# Patient Record
Sex: Male | Born: 1970 | Race: White | Hispanic: No | Marital: Married | Smoking: Never smoker
Health system: Southern US, Community
[De-identification: ages and names within clinical notes are randomized; demographics above are authoritative.]

## PROBLEM LIST (undated history)

## (undated) DIAGNOSIS — E7800 Pure hypercholesterolemia, unspecified: Secondary | ICD-10-CM

## (undated) DIAGNOSIS — E781 Pure hyperglyceridemia: Secondary | ICD-10-CM

## (undated) HISTORY — DX: Pure hyperglyceridemia: E78.1

## (undated) HISTORY — DX: Pure hypercholesterolemia, unspecified: E78.00

## (undated) HISTORY — PX: US ECHOCARDIOGRAPHY: HXRAD669

---

## 1999-10-13 HISTORY — PX: OTHER SURGICAL HISTORY: SHX169

## 2007-10-24 ENCOUNTER — Ambulatory Visit: Payer: Self-pay | Admitting: Family Medicine

## 2007-10-24 DIAGNOSIS — R05 Cough: Secondary | ICD-10-CM | POA: Insufficient documentation

## 2007-10-24 DIAGNOSIS — R059 Cough, unspecified: Secondary | ICD-10-CM | POA: Insufficient documentation

## 2007-10-24 DIAGNOSIS — R051 Acute cough: Secondary | ICD-10-CM | POA: Insufficient documentation

## 2007-11-01 ENCOUNTER — Ambulatory Visit: Payer: Self-pay | Admitting: Family Medicine

## 2007-11-02 DIAGNOSIS — E781 Pure hyperglyceridemia: Secondary | ICD-10-CM | POA: Insufficient documentation

## 2007-11-02 LAB — CONVERTED CEMR LAB
AST: 31 units/L (ref 0–37)
Albumin: 4.2 g/dL (ref 3.5–5.2)
Alkaline Phosphatase: 80 units/L (ref 39–117)
CO2: 29 meq/L (ref 19–32)
Calcium: 9.4 mg/dL (ref 8.4–10.5)
Creatinine, Ser: 1.1 mg/dL (ref 0.4–1.5)
Glucose, Bld: 85 mg/dL (ref 70–99)
HDL: 27.8 mg/dL — ABNORMAL LOW (ref >39.0)
Total CHOL/HDL Ratio: 6.3
Triglycerides: 226 mg/dL (ref 0–149)

## 2008-01-31 ENCOUNTER — Ambulatory Visit: Payer: Self-pay | Admitting: Family Medicine

## 2008-02-02 LAB — CONVERTED CEMR LAB
Cholesterol: 192 mg/dL (ref 0–200)
Direct LDL: 116.4 mg/dL
VLDL: 45 mg/dL — ABNORMAL HIGH (ref 0–40)

## 2008-03-01 ENCOUNTER — Ambulatory Visit: Payer: Self-pay | Admitting: Family Medicine

## 2008-03-01 DIAGNOSIS — M25539 Pain in unspecified wrist: Secondary | ICD-10-CM | POA: Insufficient documentation

## 2008-03-07 ENCOUNTER — Encounter: Payer: Self-pay | Admitting: Family Medicine

## 2008-03-30 ENCOUNTER — Ambulatory Visit: Payer: Self-pay | Admitting: Family Medicine

## 2008-04-11 ENCOUNTER — Telehealth: Payer: Self-pay | Admitting: Family Medicine

## 2008-04-17 ENCOUNTER — Encounter: Payer: Self-pay | Admitting: Family Medicine

## 2008-05-03 ENCOUNTER — Telehealth: Payer: Self-pay | Admitting: Family Medicine

## 2008-05-07 ENCOUNTER — Telehealth: Payer: Self-pay | Admitting: Family Medicine

## 2008-05-10 ENCOUNTER — Encounter: Payer: Self-pay | Admitting: Family Medicine

## 2009-07-31 ENCOUNTER — Ambulatory Visit: Payer: Self-pay | Admitting: Family Medicine

## 2009-07-31 DIAGNOSIS — J069 Acute upper respiratory infection, unspecified: Secondary | ICD-10-CM | POA: Insufficient documentation

## 2010-01-27 ENCOUNTER — Ambulatory Visit: Payer: Self-pay | Admitting: Family Medicine

## 2010-01-27 DIAGNOSIS — J018 Other acute sinusitis: Secondary | ICD-10-CM | POA: Insufficient documentation

## 2010-02-26 ENCOUNTER — Ambulatory Visit: Payer: Self-pay | Admitting: Family Medicine

## 2010-02-26 DIAGNOSIS — M25579 Pain in unspecified ankle and joints of unspecified foot: Secondary | ICD-10-CM | POA: Insufficient documentation

## 2010-05-30 ENCOUNTER — Ambulatory Visit: Payer: Self-pay | Admitting: Internal Medicine

## 2010-05-30 DIAGNOSIS — M79609 Pain in unspecified limb: Secondary | ICD-10-CM | POA: Insufficient documentation

## 2010-06-02 ENCOUNTER — Telehealth: Payer: Self-pay | Admitting: Family Medicine

## 2010-07-21 ENCOUNTER — Ambulatory Visit: Payer: Self-pay | Admitting: Family Medicine

## 2010-11-11 NOTE — Assessment & Plan Note (Signed)
Summary: having pain in right foot/alc   Vital Signs:  Patient profile:   40 year old male Weight:      201.50 pounds Temp:     98.2 degrees F oral Pulse rate:   56 / minute Pulse rhythm:   regular BP sitting:   110 / 70  (left arm) Cuff size:   large  Vitals Entered By: Selena Batten Dance CMA Duncan Dull) (May 30, 2010 11:41 AM) CC: Right foot pain   History of Present Illness: CC: right foot pain   Since spring has had R foot issues.  Inside heel of R foot.  notices pain after running or hiking  ~2 miles.  No fall on foot, denies injury, denies surgery to foot in past.  No other joint or muscle pain.  Has had plantar fasciitis in past on left, but was more distal plantar pain than currently.  Has done stretching exercises and golf ball, not improving.  usually runs about 3 miles 2-3x/wk, has stopped this since March.  Has changed shoes to ones with more support.  h/o high arch, was in custom orthotics years ago and didn't like the feel.  Allergies (verified): No Known Drug Allergies PMH-FH-SH reviewed for relevance  Review of Systems       per hpi  Physical Exam  General:  Well-developed,well-nourished,in no acute distress; alert,appropriate and cooperative throughout examination Msk:  L foot: no pain or tenderness or deformity R foot: mild tenderness to deep palpation at medial calcaneus.  No bruising, no edema, erythema, or warmth.  No pain at achilles tendon or distal plantar fascia.  Unable to palpate calcaneal spur.  + significant pes cavus R > L Pulses:  2+ DP/PT bilat Psych:  Cognition and judgment appear intact. Alert and cooperative with normal attention span and concentration. No apparent delusions, illusions, hallucinations   Impression & Recommendations:  Problem # 1:  HEEL PAIN, RIGHT (ICD-729.5) not consistent with plantar fasciitis.  xray today to eval heel spur, very small spur noted.  Rec pt try heel cushion for normal walking, not running or hiking.  will discuss  with Dr. Patsy Lager about other evaluation options and get back to patient. Orders: T-Foot Right ([id number])  Patient Instructions: 1)  Xrays today with small heel spur 2)  I will talk with Dr. Patsy Lager about your foot pain and we will call you wtih the next step. 3)  Pleasure to meet you today.  Prior Medications: None Current Allergies (reviewed today): No known allergies

## 2010-11-11 NOTE — Assessment & Plan Note (Signed)
Summary: R FOOT PAIN,COUGH/CLE   Vital Signs:  Patient profile:   40 year old male Height:      75 inches Weight:      203.4 pounds BMI:     25.52 Temp:     98.4 degrees F oral Pulse rate:   64 / minute Pulse rhythm:   regular BP sitting:   120 / 70  (left arm) Cuff size:   regular  Vitals Entered By: Benny Lennert CMA Duncan Dull) (Feb 26, 2010 10:39 AM)  History of Present Illness: Chief complaint right foot pain, cough  1. Right foot pain:  2. Cough:  Came in a while ago and has been having a cough. Dex with acid reflux in the past.  Has been getting better. Getting better with proilosec.   For a couple of months. Has been having some pain in his foot. Has had some PF in the past.  Some stiffness in his true ankle.   Hurting a lot with running. Deep around where the talus is.   A couple of miles two times a week.  Now, rested for a couple of weeks.    Allergies (verified): No Known Drug Allergies  Past History:  Past Medical History: PURE HYPERCHOLESTEROLEMIA (ICD-272.0) HYPERTRIGLYCERIDEMIA (ICD-272.1)  Review of Systems      See HPI General:  Denies fever and weakness.  Physical Exam  General:  GEN: WDWN, NAD, Non-toxic, A & O x 3 HEENT: Atraumatic, Normocephalic. Neck supple. No masses, No LAD. Ears and Nose: No external deformity. CV: RRR, No M/G/R. No JVD. No thrill. No extra heart sounds. PULM: CTA B, no wheezes, crackles, rhonchi. No retractions. No resp. distress. No accessory muscle use. EXTR: No c/c/e NEURO: Normal gait.  PSYCH: Normally interactive. Conversant. Not depressed or anxious appearing.  Calm demeanor.   Msk:  Echymosis: no Edema: no - NO EFFUSION AT ANKLE ROM: full LE B Gait: heel toe, non-antalgic MT pain: no Callus pattern: none Lateral Mall: NT Medial Mall: NT Talus: NT Navicular: NT Cuboid: NT Calcaneous: NT Metatarsals: NT 5th MT: NT Phalanges: NT Achilles: NT Plantar Fascia: NT Fat Pad: NT Peroneals: NT Post Tib:  NT Great Toe: Nml motion Ant Drawer: neg ATFL: NT CFL: NT Deltoid: NT Other foot breakdown: none Long arch: preserved - PES CAVUS DRAMATICALLY Transverse arch: preserved Hindfoot breakdown: none Sensation: intact    Impression & Recommendations:  Problem # 1:  ANKLE, PAIN (ICD-719.47) cannot cause pain in the office today on vigorous exam  by history - sounds as if at the talus. significant pes cavus - i suspect some impact with running as root. discussed that bone scan vs MRI could be considered - talar dome injury is possible. for now, pt would like to treat conservatively, which i think is reasonable  Problem # 2:  URI (ICD-465.9) Assessment: Improved  The following medications were removed from the medication list:    Tessalon Perles 100 Mg Caps (Benzonatate) .Marland Kitchen... 1 cap three times a day as needed cough  Patient Instructions: 1)  Hapad: available at www.hapad.com 2)  SPENCO: Available at some sports stores or www.amazon.com 3)  www.pedifix.com and Investment banker, [employer]" website: multiple good foot products 4)  [facility] in [place]: excellent staff, shoe selection, OTC orthotics 5)  Shoes: Birkenstock shoes, [employer] 6)  ***[facility], [address] [place], [place]***   Current Allergies (reviewed today): No known allergies

## 2010-11-11 NOTE — Assessment & Plan Note (Signed)
Summary: CHEST CONGESTION/DLO   Vital Signs:  Patient profile:   40 year old male Weight:      204.25 pounds Temp:     101.0 degrees F oral Pulse rate:   92 / minute Pulse rhythm:   regular BP sitting:   116 / 70  (left arm) Cuff size:   large  Vitals Entered By: Sydell Axon LPN (July 21, 2010 3:32 PM) CC: Body aches, fever, headache, chest congestion and cough   History of Present Illness: Started Thursday, worse Friday.  Fever, aches, chest congestion, cough-dry usually.  Fever since Friday.  101 today.  Occ HA.  No ear pain.  minimal ST, related to coughing.  Possible sick contacts.  Sx usually increase at night.  No wheeze.    Allergies: No Known Drug Allergies  Past History:  Past Surgical History: ECHO: no MVP, benign murmur 2001 ACL replacement  Review of Systems       See HPI.  Otherwise negative.    Physical Exam  General:  GEN: nad, alert and oriented HEENT: mucous membranes moist, TM w/o erythema, nasal epithelium injected, OP with cobblestoning NECK: supple w/o LA CV: rrr. PULM: no inc wob but with bilateral ronchi, cough with deep breath ABD: soft, +bs EXT: no edema    Impression & Recommendations:  Problem # 1:  BRONCHITIS- ACUTE (ICD-466.0) I would treat for presumed atypical PNA.  This doesn't resemble the typical URI/viral process recently prevalent in the community.  Given the exam, I would proceed with antibiotics.  This was d/w patient, including risk and benefit.  He understood/agreed.  His updated medication list for this problem includes:    Alka-seltzer Plus Cold & Cough 02-10-09-325 Mg Caps (Phenyleph-cpm-dm-apap) .Marland Kitchen... As needed    Mucinex 600 Mg Xr12h-tab (Guaifenesin) .Marland Kitchen... As needed    Zithromax 250 Mg Tabs (Azithromycin) .Marland Kitchen... 2 by mouth today, 1 by mouth for 4 days.    Hydrocodone-homatropine 5-1.5 Mg/109ml Syrp (Hydrocodone-homatropine) .Marland KitchenMarland KitchenMarland KitchenMarland Kitchen 5ml by mouth q6h as needed cough  Complete Medication List: 1)  Aspirin 325 Mg Tabs  (Aspirin) .... As needed 2)  Alka-seltzer Plus Cold & Cough 02-10-09-325 Mg Caps (Phenyleph-cpm-dm-apap) .... As needed 3)  Mucinex 600 Mg Xr12h-tab (Guaifenesin) .... As needed 4)  Zithromax 250 Mg Tabs (Azithromycin) .... 2 by mouth today, 1 by mouth for 4 days. 5)  Hydrocodone-homatropine 5-1.5 Mg/36ml Syrp (Hydrocodone-homatropine) .... 5ml by mouth q6h as needed cough  Patient Instructions: 1)  Take 2 regular tylenol every 6-8 hours.  You can alternate with regular ibuprofen, 3 tabs every 6-8 hours with food.  Drink plenty of fluids and use the cough syrup (it may make you drowsy).  Start the antibiotics today.   2)  Out of work until decrease in cough and no fever.   Prescriptions: HYDROCODONE-HOMATROPINE 5-1.5 MG/5ML SYRP (HYDROCODONE-HOMATROPINE) 5ml by mouth q6h as needed cough  #120 ml x 0   Entered and Authorized by:   Crawford Givens MD   Signed by:   Crawford Givens MD on 07/21/2010   Method used:   Print then Give to Patient   RxID:   [id number] ZITHROMAX 250 MG TABS (AZITHROMYCIN) 2 by mouth today, 1 by mouth for 4 days.  #6 x 0   Entered and Authorized by:   Crawford Givens MD   Signed by:   Crawford Givens MD on 07/21/2010   Method used:   Print then Give to Patient   RxID:   [id number]   Current Allergies (reviewed today): No  known allergies

## 2010-11-11 NOTE — Progress Notes (Signed)
Summary: phone note f/u  Phone Note Outgoing Call Call back at [facility] Phone [phone]   Summary of Call: called to discuss foot pain, would recommend treat for now as plantar fasciitis with heel cushion, ice water baths if on feet long periods of time, anti inflammatories when pain bad.  Heel spur so small that likely not causing these symptoms.  [phone] Initial call taken by: Eustaquio Boyden  MD,  June 02, 2010 1:58 PM  Follow-up for Phone Call        Patient returning your phone call from yesterday. He can be reached on his cell phone today. Follow-up by: Melody Comas,  June 03, 2010 10:10 AM  Additional Follow-up for Phone Call Additional follow up Details #1::        did he leave his cell phone #?  or is it the one above? Additional Follow-up by: Eustaquio Boyden  MD,  June 03, 2010 10:21 AM    Additional Follow-up for Phone Call Additional follow up Details #2::    It's the one above.  Melody Comas  June 03, 2010 10:21 AM  called and discussed with patient above info.  rec treat as plantar fasciitis for next few months, course of fasciitis is longterm, normally see improvement after 6 months.   Follow-up by: Eustaquio Boyden  MD,  June 03, 2010 11:11 AM

## 2010-11-11 NOTE — Assessment & Plan Note (Signed)
Summary: ?SINUS INFECTION/CLE   Vital Signs:  Patient profile:   40 year old male Weight:      204 pounds Temp:     98.8 degrees F oral Pulse rate:   68 / minute Pulse rhythm:   regular BP sitting:   130 / 82  (left arm) Cuff size:   regular  Vitals Entered By: Lowella Petties CMA (January 27, 2010 4:12 PM) CC: Cough, bloody sinus drainage   History of Present Illness: 40 yo with 2 weeks of sinus congestion. Started with runny nose, dry cough, malaise, fever.  Symptoms were improving until two days ago, started getting worsening sinus congestion, blood streaked sinus drainage and chills (no fever).  Taking OTC mucinex which is no longer helping with symptoms.  No wheezing, CP or SOB.  Current Medications (verified): 1)  Azithromycin 250 Mg  Tabs (Azithromycin) .... 2 By  Mouth Today and Then 1 Daily For 4 Days 2)  Tessalon Perles 100 Mg  Caps (Benzonatate) .Marland Kitchen.. 1 Cap Three Times A Day As Needed Cough  Allergies (verified): No Known Drug Allergies  Review of Systems      See HPI General:  Complains of chills; denies fever. ENT:  Complains of nasal congestion, sinus pressure, and sore throat; denies difficulty swallowing. CV:  Denies chest pain or discomfort. Resp:  Complains of cough; denies shortness of breath, sputum productive, and wheezing.  Physical Exam  General:  Well-developed,well-nourished,in no acute distress; alert,appropriate and cooperative throughout examination, minimally congested. Nose:  Boggy turbinates, sinuses pos throughout Mouth:  Oral mucosa and oropharynx without lesions or exudates.  Teeth in good repair. Lungs:  Normal respiratory effort, chest expands symmetrically. Lungs are clear to auscultation, no crackles or wheezes. Heart:  Normal rate and regular rhythm. S1 and S2 normal without gallop, murmur, click, rub or other extra sounds. Extremities:  no edema Psych:  Cognition and judgment appear intact. Alert and cooperative with normal attention  span and concentration. No apparent delusions, illusions, hallucinations   Impression & Recommendations:  Problem # 1:  OTHER ACUTE SINUSITIS (ICD-461.8) Assessment New Given duration and progression of symptoms, will treat for bacterial sinusitis. He reports resistance to amoxicillin in past.   Continue supportive care as per pt instructions. His updated medication list for this problem includes:    Azithromycin 250 Mg Tabs (Azithromycin) .Marland Kitchen... 2 by  mouth today and then 1 daily for 4 days    Tessalon Perles 100 Mg Caps (Benzonatate) .Marland Kitchen... 1 cap three times a day as needed cough  Complete Medication List: 1)  Azithromycin 250 Mg Tabs (Azithromycin) .... 2 by  mouth today and then 1 daily for 4 days 2)  Tessalon Perles 100 Mg Caps (Benzonatate) .Marland Kitchen.. 1 cap three times a day as needed cough  Patient Instructions: 1)  Take antibiotic as directed.  Drink lots of fluids.  Treat sympotmatically with Mucinex, nasal saline irrigation, and Tylenol/Ibuprofen. Also try claritin D or zyrtec D over the counter- two times a day as needed ( have to sign for them at pharmacy). You can use warm compresses.  Cough suppressant at night. Call if not improving as expected in 5-7 days.  Prescriptions: TESSALON PERLES 100 MG  CAPS (BENZONATATE) 1 cap three times a day as needed cough  #30 x 0   Entered and Authorized by:   Ruthe Mannan MD   Signed by:   Ruthe Mannan MD on 01/27/2010   Method used:   Electronically to  [facility]  [address] (retail)       [address]       [place], [place]  [address]       Ph: [phone] or [phone]       Fax: [phone]   RxID:   [id number] AZITHROMYCIN 250 MG  TABS (AZITHROMYCIN) 2 by  mouth today and then 1 daily for 4 days  #6 x 0   Entered and Authorized by:   Ruthe Mannan MD   Signed by:   Ruthe Mannan MD on 01/27/2010   Method used:   [facility] to        [facility]  [address] (retail)       [address]       [place],  [place]  [address]       Ph: [phone] or [phone]       Fax: [phone]   RxID:   [id number]   Prior Medications: Current Allergies (reviewed today): No known allergies

## 2011-02-23 ENCOUNTER — Ambulatory Visit (INDEPENDENT_AMBULATORY_CARE_PROVIDER_SITE_OTHER): Payer: Managed Care, Other (non HMO) | Admitting: Family Medicine

## 2011-02-23 ENCOUNTER — Encounter: Payer: Self-pay | Admitting: Family Medicine

## 2011-02-23 VITALS — BP 118/70 | HR 72 | Temp 98.4°F | Ht 76.0 in | Wt 205.1 lb

## 2011-02-23 DIAGNOSIS — J329 Chronic sinusitis, unspecified: Secondary | ICD-10-CM | POA: Insufficient documentation

## 2011-02-23 MED ORDER — FLUTICASONE PROPIONATE 50 MCG/ACT NA SUSP: 2.0000 | Freq: Every day | NASAL | Status: DC

## 2011-02-23 NOTE — Patient Instructions (Addendum)
You could have early sinus infection.  Most are viral initially. Take medicine as prescribed: flonase 2 sprays into each nostril daily. Push fluids and plenty of rest. Nasal saline irrigation or neti pot to help drain sinuses. May use simple mucinex with plenty of fluid to help mobilize mucous. Return if fever >101.5, trouble opening/closing mouth, difficulty swallowing, or worsening.  Call us if not improving by Wednesday.

## 2011-02-23 NOTE — Progress Notes (Signed)
  Subjective:    Patient ID: Ray Park, male    DOB: May 29, 1971, 40 y.o.   MRN: [id number]  HPI CC: sinus infection?  1wk h/o fever, cold sxs.  Last few days worsening phlegm, sinus congestion, yellow thick discharge.  Currently lots of congestion, hoarse voice.  Worse at night.  No cough.  Has been drinking fluids, gargling with salt water.  Took 1 day off work.  Hasn't tried anything else so far.  + sinus pressure.  No fevers/chills, abd pain, n/v/d, rashes.  No ear pain, tooth pain.  No HA.  No sick contacts at home.  No smokers at home.  No h/o asthma.  + h/o allergies.  Review of Systems Per HPI    Objective:   Physical Exam  Nursing note and vitals reviewed. Constitutional: He appears well-developed and well-nourished. No distress.  HENT:  Head: Normocephalic and atraumatic.  Right Ear: Tympanic membrane, external ear and ear canal normal.  Left Ear: Tympanic membrane, external ear and ear canal normal.  Nose: Nose normal. No mucosal edema or rhinorrhea. Right sinus exhibits no maxillary sinus tenderness and no frontal sinus tenderness. Left sinus exhibits no maxillary sinus tenderness and no frontal sinus tenderness.  Mouth/Throat: Oropharynx is clear and moist. No oropharyngeal exudate.  Eyes: Conjunctivae and EOM are normal. Pupils are equal, round, and reactive to light. No scleral icterus.  Neck: Normal range of motion. Neck supple. No thyromegaly present.  Cardiovascular: Normal rate, regular rhythm, normal heart sounds and intact distal pulses.   No murmur heard. Pulmonary/Chest: Effort normal and breath sounds normal. No respiratory distress. He has no wheezes. He has no rales.  Lymphadenopathy:    He has no cervical adenopathy.  Skin: Skin is warm and dry. No rash noted.          Assessment & Plan:

## 2011-02-23 NOTE — Assessment & Plan Note (Addendum)
Early on, likely viral.  Treat supportively.  Start flonase Call with update if not improving as expected by mid week for consideration of abx course.

## 2011-07-07 IMAGING — CR DG FOOT COMPLETE 3+V*R*
3 series · 3 of 3 positions shown · non-contrast
Comparison: None.

CLINICAL DATA: Pain.

RIGHT FOOT COMPLETE - 3+ VIEW

[view not recorded (1 of 3)]
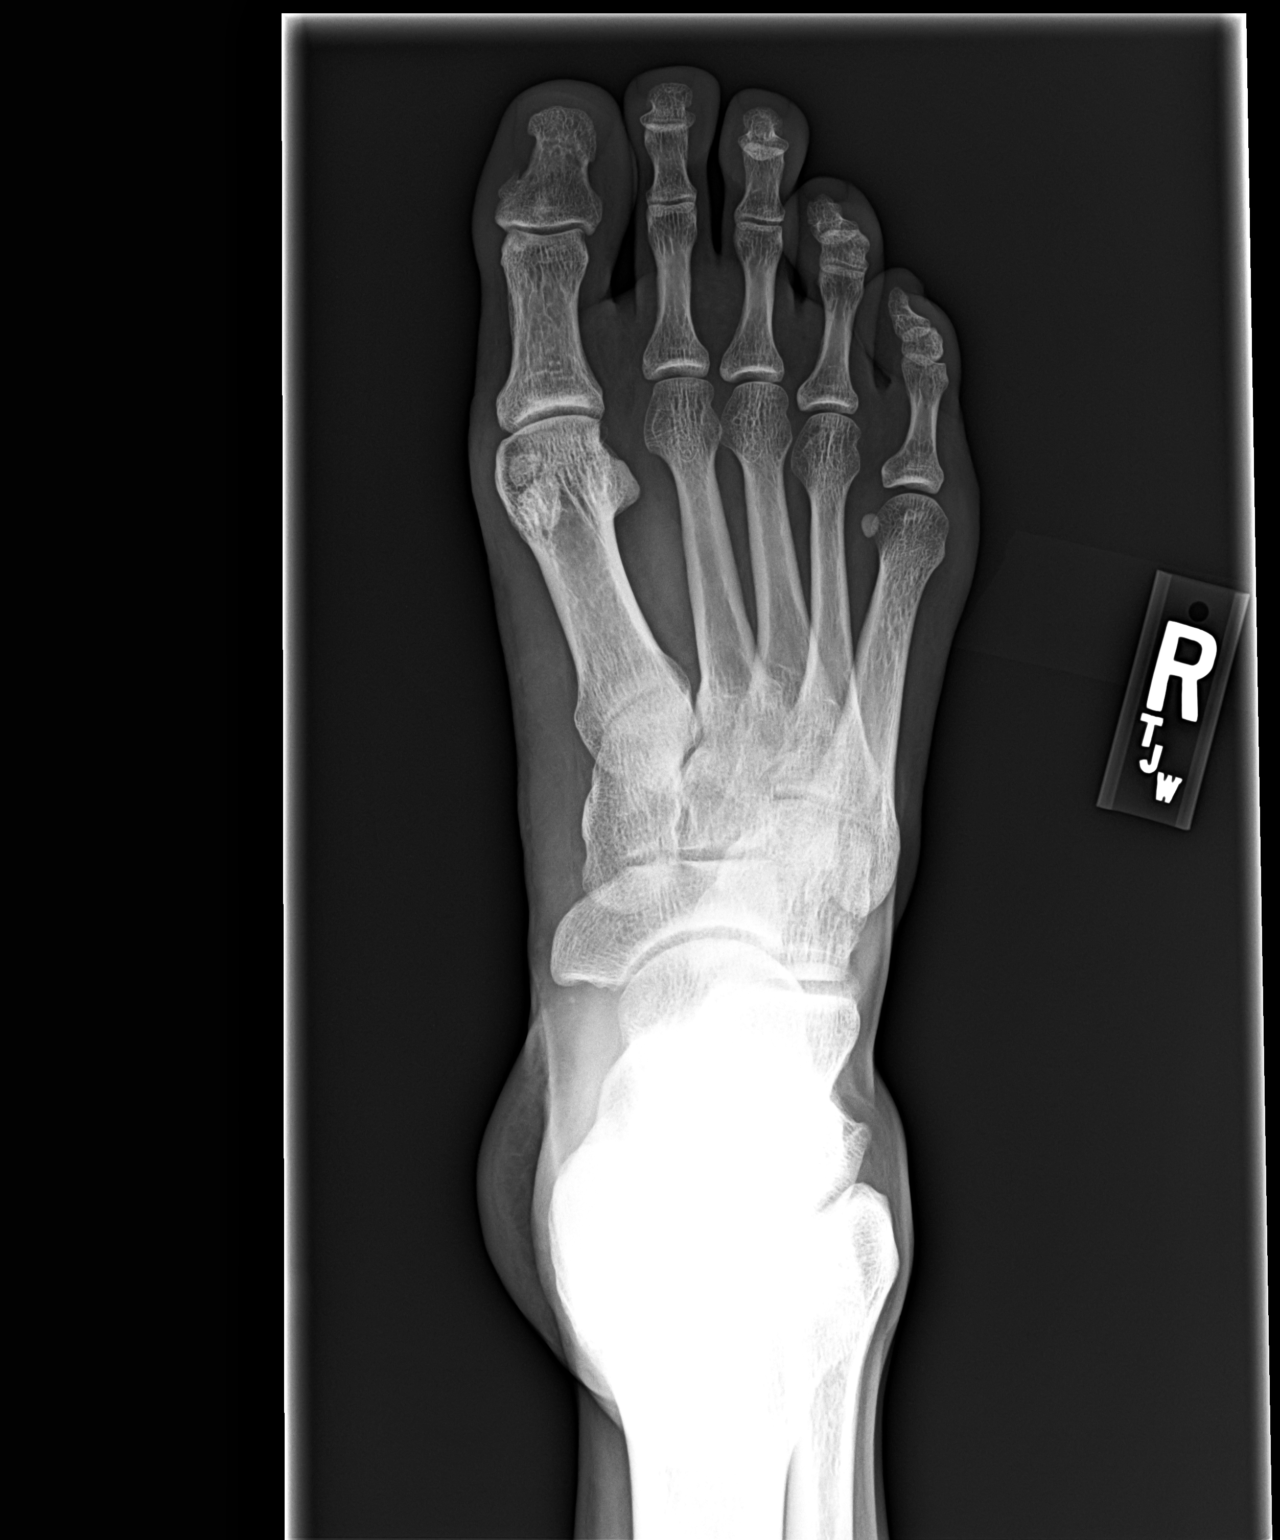

[view not recorded (2 of 3)]
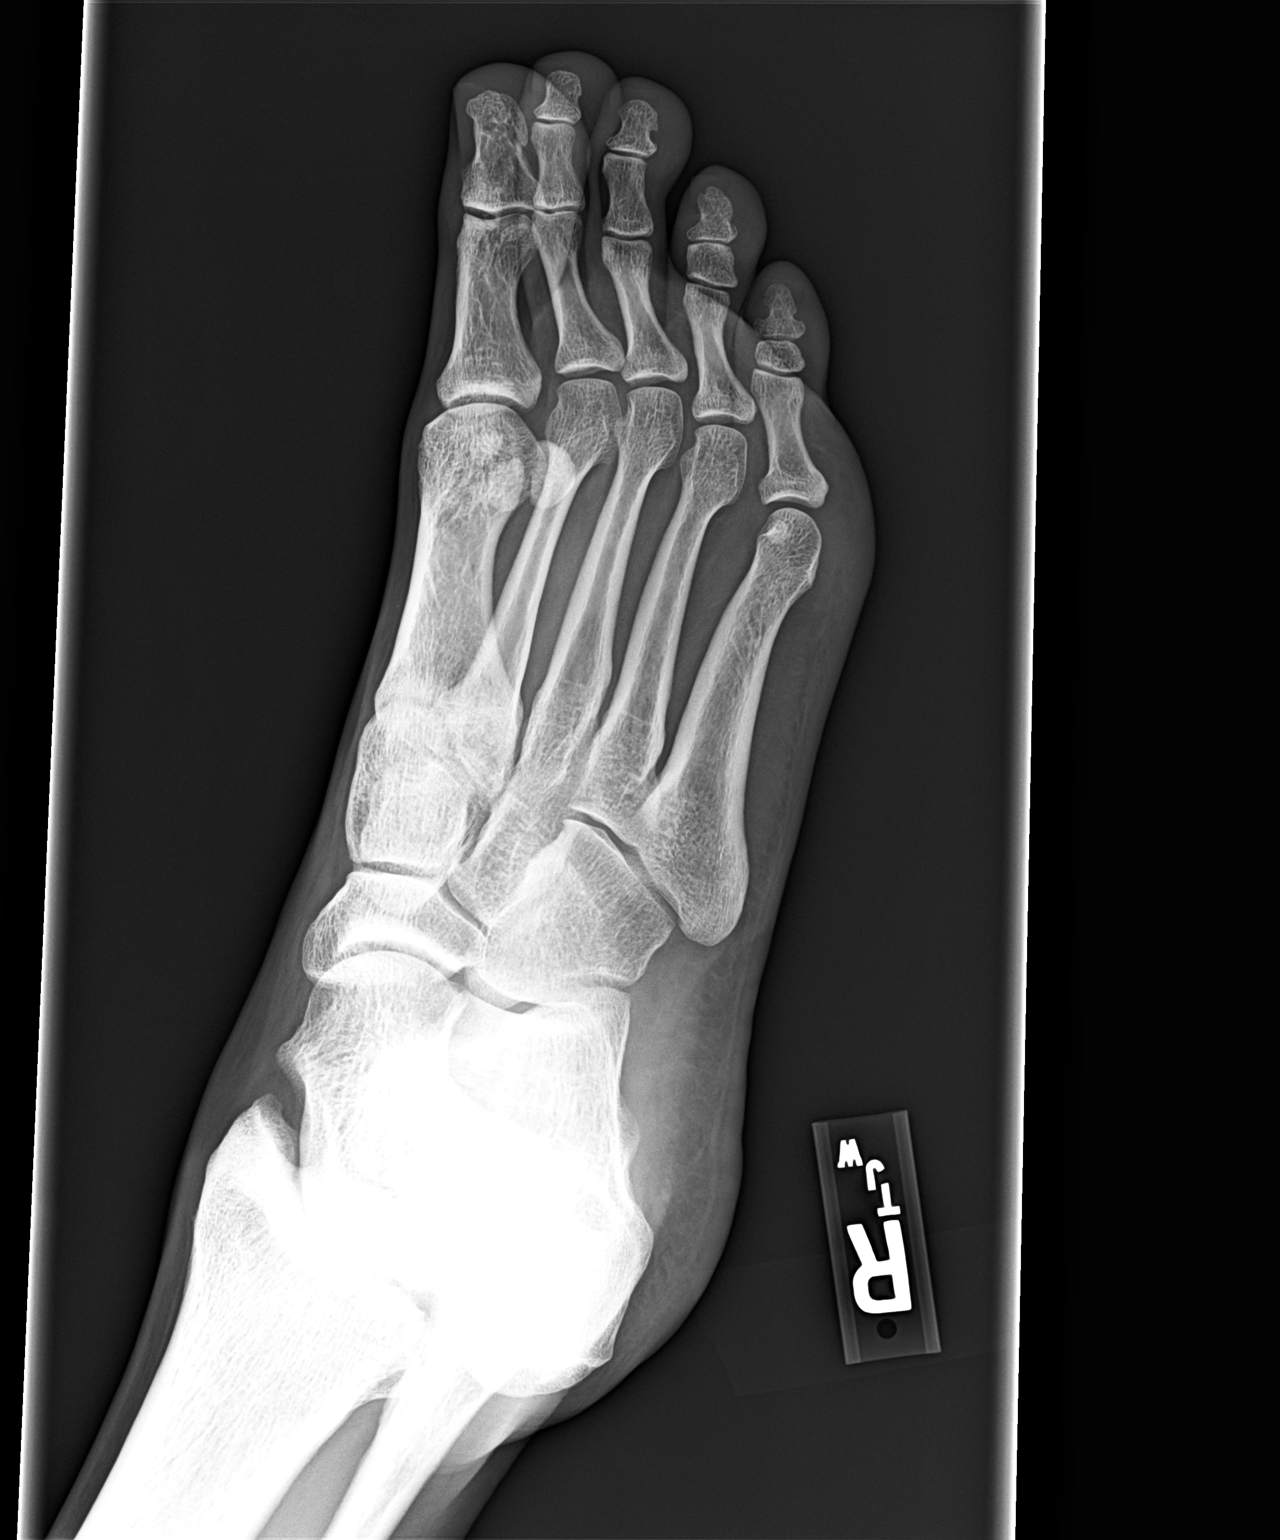

[view not recorded (3 of 3)]
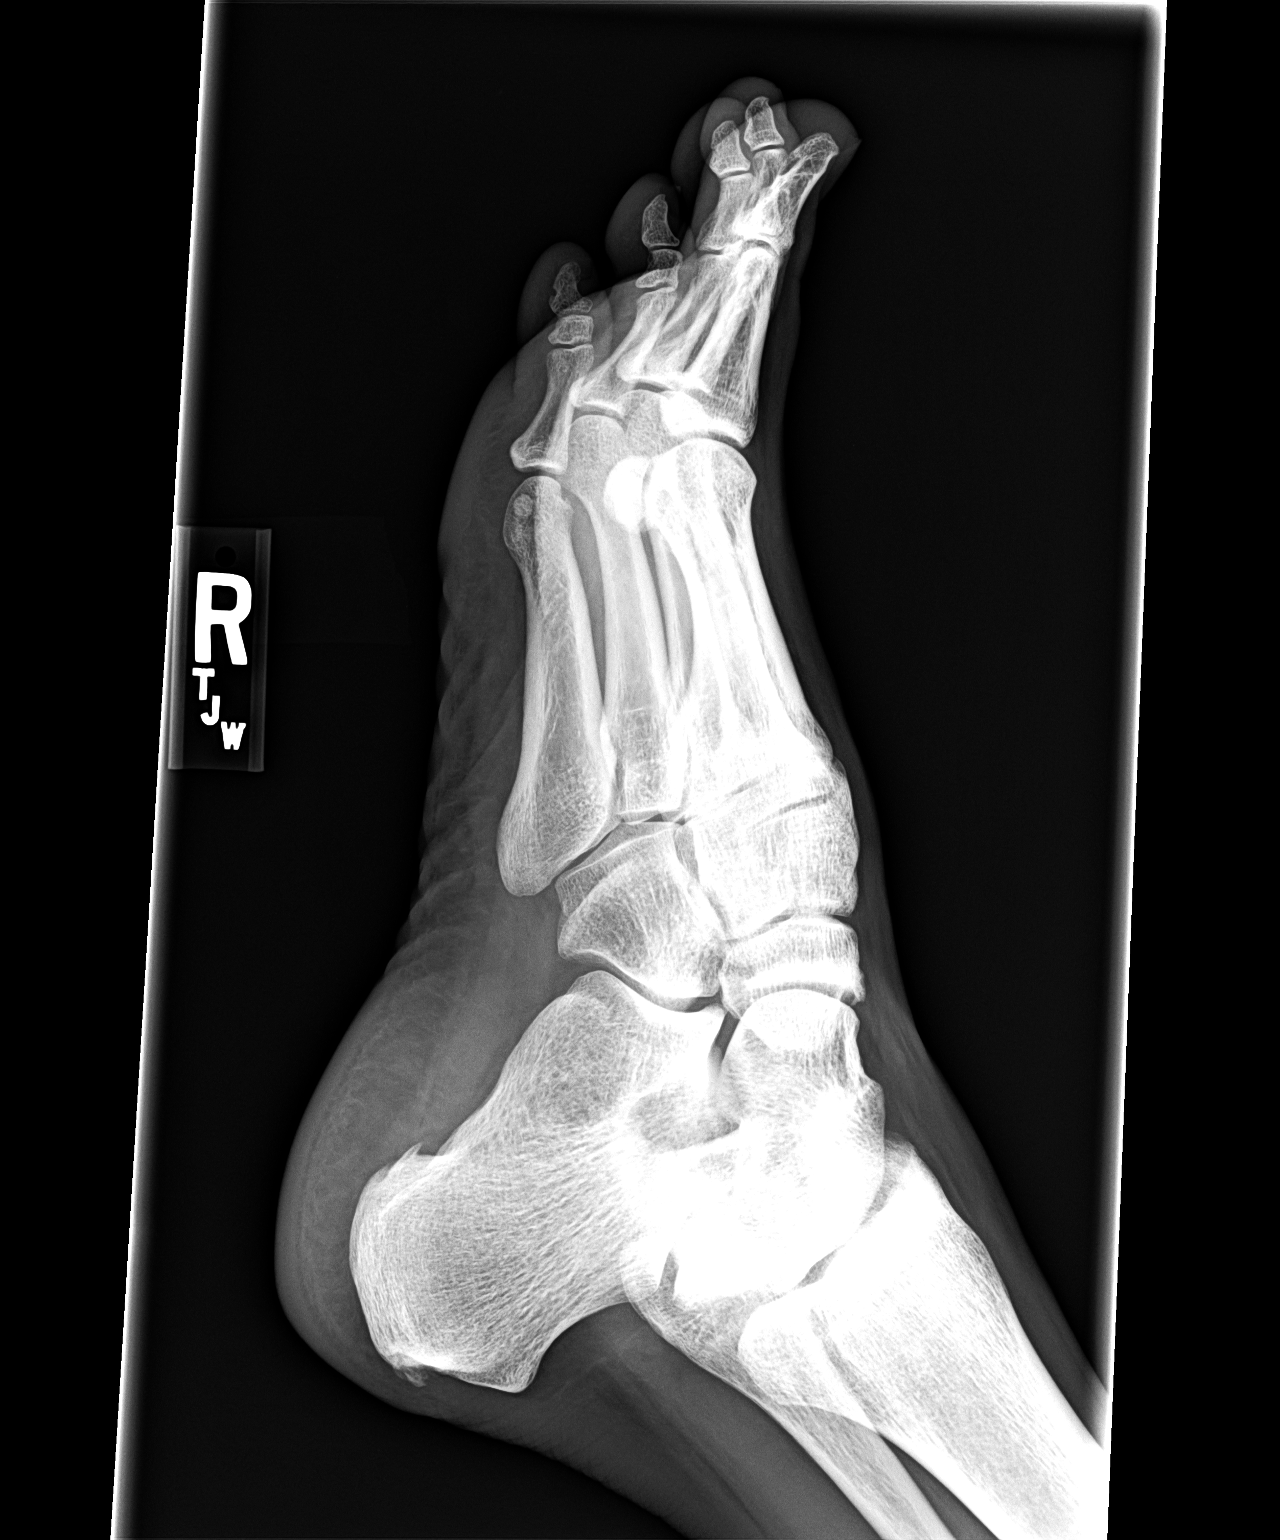

[3 of 3 positions shown; findings below may reference images not displayed]

FINDINGS: There is no acute bony or joint abnormality.  The patient
has very small dorsal and plantar calcaneal spurs.  Soft tissues
unremarkable.
IMPRESSION: Mild calcaneal spurring.  Otherwise negative.

## 2011-10-20 ENCOUNTER — Ambulatory Visit (INDEPENDENT_AMBULATORY_CARE_PROVIDER_SITE_OTHER): Payer: Managed Care, Other (non HMO) | Admitting: Family Medicine

## 2011-10-20 ENCOUNTER — Encounter: Payer: Self-pay | Admitting: Family Medicine

## 2011-10-20 VITALS — BP 120/80 | HR 63 | Temp 97.8°F | Ht 76.0 in | Wt 209.8 lb

## 2011-10-20 DIAGNOSIS — Z113 Encounter for screening for infections with a predominantly sexual mode of transmission: Secondary | ICD-10-CM | POA: Insufficient documentation

## 2011-10-20 NOTE — Progress Notes (Signed)
  Subjective:    Patient ID: Ray Park, male    DOB: 28-Oct-1970, 41 y.o.   MRN: [id number]  HPI  41 year old male overdue for CPX presents to clinic today requesting an HIV test. He reports  He has a new girlfriend and this is the only reason he is doing the eval, prior to any sexual activity. No known exposures. He is not interested in any other STD testing. No penile, no pain, no abdominal, no fever.    Review of Systems  Constitutional: Negative for fever, fatigue and unexpected weight change.  Respiratory: Negative for shortness of breath and wheezing.   Cardiovascular: Negative for chest pain, palpitations and leg swelling.  Gastrointestinal: Negative for nausea, diarrhea and blood in stool.  Genitourinary: Negative for urgency, hematuria, flank pain, decreased urine volume, penile swelling, scrotal swelling, difficulty urinating, genital sores, penile pain and testicular pain.       Objective:   Physical Exam  Constitutional: He appears well-developed and well-nourished.  Cardiovascular: Normal rate, regular rhythm, normal heart sounds and intact distal pulses.  Exam reveals no gallop and no friction rub.   No murmur heard. Pulmonary/Chest: Effort normal and breath sounds normal. No respiratory distress. He has no wheezes. He has no rales. He exhibits no tenderness.  Genitourinary:       refused  Skin: Skin is warm and dry. No rash noted. No erythema. No pallor.          Assessment & Plan:

## 2011-10-20 NOTE — Assessment & Plan Note (Signed)
Eval for HIV with reflex testing.  Offered other STD testing and genital exam.. Pt refused.  He will return for CPX.

## 2011-10-20 NOTE — Patient Instructions (Signed)
Schedule CPX and fasting labs prior in 02/2012.

## 2011-10-21 LAB — HIV ANTIBODY (ROUTINE TESTING W REFLEX): HIV: NONREACTIVE

## 2013-07-07 ENCOUNTER — Encounter: Payer: Self-pay | Admitting: Family Medicine

## 2013-11-17 ENCOUNTER — Other Ambulatory Visit (INDEPENDENT_AMBULATORY_CARE_PROVIDER_SITE_OTHER): Payer: Managed Care, Other (non HMO)

## 2013-11-17 ENCOUNTER — Telehealth: Payer: Self-pay | Admitting: Family Medicine

## 2013-11-17 DIAGNOSIS — E7800 Pure hypercholesterolemia, unspecified: Secondary | ICD-10-CM

## 2013-11-17 LAB — COMPREHENSIVE METABOLIC PANEL
ALT: 28 U/L (ref 0–53)
AST: 21 U/L (ref 0–37)
Albumin: 4.4 g/dL (ref 3.5–5.2)
Alkaline Phosphatase: 68 U/L (ref 39–117)
BUN: 15 mg/dL (ref 6–23)
CHLORIDE: 107 meq/L (ref 96–112)
CO2: 29 meq/L (ref 19–32)
CREATININE: 1.1 mg/dL (ref 0.4–1.5)
Calcium: 9.3 mg/dL (ref 8.4–10.5)
GFR: 74.73 mL/min (ref >60.00)
GLUCOSE: 88 mg/dL (ref 70–99)
Potassium: 3.8 mEq/L (ref 3.5–5.1)
Sodium: 142 mEq/L (ref 135–145)
Total Bilirubin: 0.6 mg/dL (ref 0.3–1.2)
Total Protein: 7 g/dL (ref 6.0–8.3)

## 2013-11-17 LAB — LIPID PANEL
CHOL/HDL RATIO: 5
CHOLESTEROL: 181 mg/dL (ref 0–200)
HDL: 33.9 mg/dL — ABNORMAL LOW (ref >39.00)
LDL Cholesterol: 109 mg/dL — ABNORMAL HIGH (ref 0–99)
TRIGLYCERIDES: 190 mg/dL — AB (ref 0.0–149.0)
VLDL: 38 mg/dL (ref 0.0–40.0)

## 2013-11-17 NOTE — Telephone Encounter (Signed)
Message copied by Excell SeltzerBEDSOLE, Nema Oatley E on Fri Nov 17, 2013  8:32 AM ------      Message from: Alvina ChouWALSH, TERRI J      Created: Tue Nov 07, 2013  5:18 PM      Regarding: Lab orders for Friday, 2.6.15       Patient is scheduled for CPX labs, please order future labs, Thanks , Terri       ------

## 2013-11-24 ENCOUNTER — Ambulatory Visit (INDEPENDENT_AMBULATORY_CARE_PROVIDER_SITE_OTHER): Payer: Managed Care, Other (non HMO) | Admitting: Family Medicine

## 2013-11-24 ENCOUNTER — Encounter: Payer: Self-pay | Admitting: Family Medicine

## 2013-11-24 VITALS — BP 116/82 | HR 59 | Temp 98.4°F | Ht 75.5 in | Wt 206.0 lb

## 2013-11-24 DIAGNOSIS — E781 Pure hyperglyceridemia: Secondary | ICD-10-CM

## 2013-11-24 DIAGNOSIS — Z3009 Encounter for other general counseling and advice on contraception: Secondary | ICD-10-CM

## 2013-11-24 DIAGNOSIS — E7800 Pure hypercholesterolemia, unspecified: Secondary | ICD-10-CM

## 2013-11-24 DIAGNOSIS — Z0000 Encounter for general adult medical examination without abnormal findings: Secondary | ICD-10-CM

## 2013-11-24 NOTE — Patient Instructions (Addendum)
Incease exercise, work on low fat diet, increase veggie fats. Omega three fatty acids 1000 mg twice daily. Stop at front desk on way out.

## 2013-11-24 NOTE — Assessment & Plan Note (Signed)
LDL at goal 

## 2013-11-24 NOTE — Progress Notes (Signed)
   Subjective:    Patient ID: Ray Park, male    DOB: Jan 30, 1971, 43 y.o.   MRN: [id number]  HPI The patient is here for annual wellness exam and preventative care.    No specific concerns.  Doing well overall.  He has had hemorrhoids this week following a bout constipation.  Resolving with increase water, fiber. Prep H off and on.   Elevated Cholesterol:LDL at goal <130, but trigs high. Lab Results  Component Value Date   CHOL 181 11/17/2013   HDL 33.90* 11/17/2013   LDLCALC 109* 11/17/2013   LDLDIRECT 116.4 01/31/2008   TRIG 190.0* 11/17/2013   CHOLHDL 5 11/17/2013   Diet compliance: Good Exercise: sporadic Other complaints:  Wt Readings from Last 3 Encounters:  11/24/13 206 lb (93.441 kg)  10/20/11 209 lb 12.8 oz (95.165 kg)  02/23/11 205 lb 1.3 oz (93.024 kg)    He is interested in a vasectomy.   Review of Systems  Constitutional: Negative for fever and fatigue.  HENT: Negative for ear pain.   Eyes: Negative for pain.  Respiratory: Negative for cough and shortness of breath.   Cardiovascular: Negative for chest pain, palpitations and leg swelling.  Gastrointestinal: Negative for abdominal pain.  Genitourinary: Negative for dysuria.  Neurological: Negative for light-headedness.  Psychiatric/Behavioral: Negative for behavioral problems, confusion and dysphoric mood.       Objective:   Physical Exam  Constitutional: He appears well-developed and well-nourished.  Non-toxic appearance. He does not appear ill. No distress.  HENT:  Head: Normocephalic and atraumatic.  Right Ear: Hearing, tympanic membrane, external ear and ear canal normal.  Left Ear: Hearing, tympanic membrane, external ear and ear canal normal.  Nose: Nose normal.  Mouth/Throat: Uvula is midline, oropharynx is clear and moist and mucous membranes are normal.  Eyes: Conjunctivae, EOM and lids are normal. Pupils are equal, round, and reactive to light. Lids are everted and swept, no foreign bodies  found.  Neck: Trachea normal, normal range of motion and phonation normal. Neck supple. Carotid bruit is not present. No mass and no thyromegaly present.  Cardiovascular: Normal rate, regular rhythm, S1 normal, S2 normal, intact distal pulses and normal pulses.  Exam reveals no gallop.   No murmur heard. Pulmonary/Chest: Breath sounds normal. He has no wheezes. He has no rhonchi. He has no rales.  Abdominal: Soft. Normal appearance and bowel sounds are normal. There is no hepatosplenomegaly. There is no tenderness. There is no rebound, no guarding and no CVA tenderness. No hernia.  Genitourinary:  Deferred as he will be seeing urologist.  Lymphadenopathy:    He has no cervical adenopathy.  Neurological: He is alert. He has normal strength and normal reflexes. No cranial nerve deficit or sensory deficit. Gait normal.  Skin: Skin is warm, dry and intact. No rash noted.  Psychiatric: He has a normal mood and affect. His speech is normal and behavior is normal. Judgment normal.          Assessment & Plan:  The patient's preventative maintenance and recommended screening tests for an annual wellness exam were reviewed in full today. Brought up to date unless services declined.  Counselled on the importance of diet, exercise, and its role in overall health and mortality. The patient's FH and SH was reviewed, including their home life, tobacco status, and drug and alcohol status.   Vaccines: Uptodate No prostate or colon cancer fam hx. Nonsmoker

## 2013-11-24 NOTE — Progress Notes (Signed)
Pre-visit discussion using our clinic review tool. No additional management support is needed unless otherwise documented below in the visit note.  

## 2013-11-24 NOTE — Assessment & Plan Note (Signed)
Inadequate control, discussed lifestyle changes and fish oil.

## 2014-08-24 ENCOUNTER — Ambulatory Visit (INDEPENDENT_AMBULATORY_CARE_PROVIDER_SITE_OTHER): Payer: Managed Care, Other (non HMO) | Admitting: Family Medicine

## 2014-08-24 ENCOUNTER — Encounter: Payer: Self-pay | Admitting: Family Medicine

## 2014-08-24 VITALS — BP 120/70 | HR 73 | Temp 98.1°F | Ht 75.5 in | Wt 208.0 lb

## 2014-08-24 DIAGNOSIS — M549 Dorsalgia, unspecified: Secondary | ICD-10-CM

## 2014-08-24 DIAGNOSIS — M546 Pain in thoracic spine: Secondary | ICD-10-CM

## 2014-08-24 DIAGNOSIS — K5900 Constipation, unspecified: Secondary | ICD-10-CM

## 2014-08-24 DIAGNOSIS — K5909 Other constipation: Secondary | ICD-10-CM | POA: Insufficient documentation

## 2014-08-24 NOTE — Patient Instructions (Addendum)
Increase fiber (metamucil) in diet, increase fluids.  Start back on regular exercise.  Trial of miralax daily. Start home stretching exercises, heat and massage. Check on ergonomic at work desk etc.  Avoid lifting > 10 lbs in next 2 weeks.

## 2014-08-24 NOTE — Progress Notes (Signed)
   Subjective:    Patient ID: Ray Park, male    DOB: May 22, 1971, 43 y.o.   MRN: [id number]  HPI  43 year old male presents with new onset low back pain.  3 weeks ago he moved in a particular way, felt pain/ache in thoracic back in middle.  The following day after sitting at work.. Had increase in pain. Pain improved with time, he has not taken any med to treat. No numbness, no weakness in legs. No fever, no urinary incontinence He has felt better this week. No current pain.  He has been working 60 hours a week.  No stretching , no exercise.  He has had recurrence of constipation lately.  BMs every 3-4 days.  No blood in stool. Usually improves with exercise.  Review of Systems  Constitutional: Negative for fever and fatigue.  HENT: Negative for ear pain.   Eyes: Negative for pain.  Respiratory: Negative for cough and shortness of breath.   Cardiovascular: Negative for chest pain.       Objective:   Physical Exam  Constitutional: Vital signs are normal. He appears well-developed and well-nourished.  HENT:  Head: Normocephalic.  Right Ear: Hearing normal.  Left Ear: Hearing normal.  Nose: Nose normal.  Mouth/Throat: Oropharynx is clear and moist and mucous membranes are normal.  Neck: Trachea normal. Carotid bruit is not present. No thyroid mass and no thyromegaly present.  Cardiovascular: Normal rate, regular rhythm and normal pulses.  Exam reveals no gallop, no distant heart sounds and no friction rub.   No murmur heard. No peripheral edema  Pulmonary/Chest: Effort normal and breath sounds normal. No respiratory distress.  Abdominal: Soft. Bowel sounds are normal. There is no tenderness.  Musculoskeletal:       Thoracic back: Normal.       Lumbar back: Normal.  Neg SLR, neg Faber's  Skin: Skin is warm, dry and intact. No rash noted.  Psychiatric: He has a normal mood and affect. His speech is normal and behavior is normal. Thought content normal.            Assessment & Plan:

## 2014-08-24 NOTE — Assessment & Plan Note (Signed)
Acute worsening due to recent poor self care.  Start back fiber, exercise, water. Can use miralax prn.

## 2014-08-24 NOTE — Assessment & Plan Note (Signed)
No radiation.  Likely MSK strain being re-injured due to poor self care.  Start home PT, heat, massage, NSAIDs prn.

## 2014-08-24 NOTE — Progress Notes (Signed)
Pre visit review using our clinic review tool, if applicable. No additional management support is needed unless otherwise documented below in the visit note. 

## 2014-11-20 ENCOUNTER — Telehealth: Payer: Self-pay | Admitting: Family Medicine

## 2014-11-20 ENCOUNTER — Other Ambulatory Visit (INDEPENDENT_AMBULATORY_CARE_PROVIDER_SITE_OTHER): Payer: BLUE CROSS/BLUE SHIELD

## 2014-11-20 DIAGNOSIS — E7800 Pure hypercholesterolemia, unspecified: Secondary | ICD-10-CM

## 2014-11-20 DIAGNOSIS — E781 Pure hyperglyceridemia: Secondary | ICD-10-CM

## 2014-11-20 LAB — COMPREHENSIVE METABOLIC PANEL
ALT: 28 U/L (ref 0–53)
AST: 24 U/L (ref 0–37)
Albumin: 4.6 g/dL (ref 3.5–5.2)
Alkaline Phosphatase: 75 U/L (ref 39–117)
BUN: 15 mg/dL (ref 6–23)
CALCIUM: 9.6 mg/dL (ref 8.4–10.5)
CHLORIDE: 103 meq/L (ref 96–112)
CO2: 31 mEq/L (ref 19–32)
CREATININE: 1.14 mg/dL (ref 0.40–1.50)
GFR: 73.64 mL/min (ref >60.00)
Glucose, Bld: 94 mg/dL (ref 70–99)
Potassium: 4.3 mEq/L (ref 3.5–5.1)
SODIUM: 140 meq/L (ref 135–145)
TOTAL PROTEIN: 6.9 g/dL (ref 6.0–8.3)
Total Bilirubin: 0.5 mg/dL (ref 0.2–1.2)

## 2014-11-20 LAB — LIPID PANEL
Cholesterol: 171 mg/dL (ref 0–200)
HDL: 36.2 mg/dL — AB (ref >39.00)
NonHDL: 134.8
Total CHOL/HDL Ratio: 5
Triglycerides: 234 mg/dL — ABNORMAL HIGH (ref 0.0–149.0)
VLDL: 46.8 mg/dL — ABNORMAL HIGH (ref 0.0–40.0)

## 2014-11-20 LAB — LDL CHOLESTEROL, DIRECT: Direct LDL: 95 mg/dL

## 2014-11-20 NOTE — Telephone Encounter (Signed)
Labs ordered.

## 2014-11-27 ENCOUNTER — Encounter: Payer: Managed Care, Other (non HMO) | Admitting: Family Medicine

## 2014-11-27 ENCOUNTER — Encounter: Payer: Self-pay | Admitting: Family Medicine

## 2014-11-27 ENCOUNTER — Ambulatory Visit (INDEPENDENT_AMBULATORY_CARE_PROVIDER_SITE_OTHER): Payer: BLUE CROSS/BLUE SHIELD | Admitting: Family Medicine

## 2014-11-27 VITALS — BP 110/64 | HR 78 | Temp 98.5°F | Ht 75.75 in | Wt 202.0 lb

## 2014-11-27 DIAGNOSIS — Z0000 Encounter for general adult medical examination without abnormal findings: Secondary | ICD-10-CM

## 2014-11-27 NOTE — Patient Instructions (Addendum)
Work on [employer] fat diet.  Increase exesise to 150 min per week. Start omega three fatty acids (fish oil or flax seed oil) 2000 mg divided daily. Can apply cortisone 10 to skin lesion on right arm and left hand.

## 2014-11-27 NOTE — Progress Notes (Signed)
HPI The patient is here for annual wellness exam and preventative care.   No specific concerns. Doing well overall.  Has several skin lesions , no changes. present 1-3 years.  Nontender.  Elevated Cholesterol:LDL at goal <130, but trigs high. Lab Results  Component Value Date   CHOL 171 11/20/2014   HDL 36.20* 11/20/2014   LDLCALC 109* 11/17/2013   LDLDIRECT 95.0 11/20/2014   TRIG 234.0* 11/20/2014   CHOLHDL 5 11/20/2014   Wt Readings from Last 3 Encounters:  11/27/14 202 lb (91.627 kg)  08/24/14 208 lb (94.348 kg)  11/24/13 206 lb (93.441 kg)   Exercise: Off and on. Diet: moderate  Under a lot of stress at work this time aof year.  Review of Systems  Constitutional: Negative for fever and fatigue.  HENT: Negative for ear pain.  Eyes: Negative for pain.  Respiratory: Negative for cough and shortness of breath.  Cardiovascular: Negative for chest pain, palpitations and leg swelling.  Gastrointestinal: Negative for abdominal pain.  Occ rectal itching and chronic constipation. Genitourinary: Negative for dysuria.  Neurological: Negative for light-headedness.  Psychiatric/Behavioral: Negative for behavioral problems, confusion and dysphoric mood.       Objective:   Physical Exam  Constitutional: He appears well-developed and well-nourished. Non-toxic appearance. He does not appear ill. No distress.  HENT:  Head: Normocephalic and atraumatic.  Right Ear: Hearing, tympanic membrane, external ear and ear canal normal.  Left Ear: Hearing, tympanic membrane, external ear and ear canal normal.  Nose: Nose normal.  Mouth/Throat: Uvula is midline, oropharynx is clear and moist and mucous membranes are normal.  Eyes: Conjunctivae, EOM and lids are normal. Pupils are equal, round, and reactive to light. Lids are everted and swept, no foreign bodies found.  Neck: Trachea normal, normal range of motion and phonation normal. Neck supple. Carotid bruit is not present. No  mass and no thyromegaly present.  Cardiovascular: Normal rate, regular rhythm, S1 normal, S2 normal, intact distal pulses and normal pulses. Exam reveals no gallop.  No murmur heard. Pulmonary/Chest: Breath sounds normal. He has no wheezes. He has no rhonchi. He has no rales.  Abdominal: Soft. Normal appearance and bowel sounds are normal. There is no hepatosplenomegaly. There is no tenderness. There is no rebound, no guarding and no CVA tenderness. No hernia.  Genitourinary: nml prostate and genital exam, no masses.No hernia noted bilaterally. Lymphadenopathy:   He has no cervical adenopathy.  Neurological: He is alert. He has normal strength and normal reflexes. No cranial nerve deficit or sensory deficit. Gait normal.  Skin: Skin is warm, dry and intact. No rash noted.  Psychiatric: He has a normal mood and affect. His speech is normal and behavior is normal. Judgment normal.     Assessment & Plan:  The patient's preventative maintenance and recommended screening tests for an annual wellness exam were reviewed in full today. Brought up to date unless services declined.  Counselled on the importance of diet, exercise, and its role in overall health and mortality. The patient's FH and SH was reviewed, including their home life, tobacco status, and drug and alcohol status.   Vaccines: Uptodate No prostate or colon cancer fam hx. Nonsmoker Refuses HIV testing.

## 2014-11-27 NOTE — Progress Notes (Signed)
Pre visit review using our clinic review tool, if applicable. No additional management support is needed unless otherwise documented below in the visit note. 

## 2015-11-21 ENCOUNTER — Telehealth: Payer: Self-pay | Admitting: Family Medicine

## 2015-11-21 DIAGNOSIS — E7800 Pure hypercholesterolemia, unspecified: Secondary | ICD-10-CM

## 2015-11-21 NOTE — Telephone Encounter (Signed)
-----   Message from Terri J Walsh sent at 11/14/2015 11:12 AM EST ----- Regarding: Lab orders for Friday, 2.10.17 Patient is scheduled for CPX labs, please order future labs, Thanks , Terri  

## 2015-11-22 ENCOUNTER — Other Ambulatory Visit (INDEPENDENT_AMBULATORY_CARE_PROVIDER_SITE_OTHER): Payer: BLUE CROSS/BLUE SHIELD

## 2015-11-22 DIAGNOSIS — E7800 Pure hypercholesterolemia, unspecified: Secondary | ICD-10-CM | POA: Diagnosis not present

## 2015-11-22 LAB — COMPREHENSIVE METABOLIC PANEL
ALBUMIN: 4.6 g/dL (ref 3.5–5.2)
ALK PHOS: 66 U/L (ref 39–117)
ALT: 22 U/L (ref 0–53)
AST: 22 U/L (ref 0–37)
BILIRUBIN TOTAL: 0.5 mg/dL (ref 0.2–1.2)
BUN: 16 mg/dL (ref 6–23)
CALCIUM: 9.9 mg/dL (ref 8.4–10.5)
CO2: 32 mEq/L (ref 19–32)
CREATININE: 1.12 mg/dL (ref 0.40–1.50)
Chloride: 103 mEq/L (ref 96–112)
GFR: 74.83 mL/min (ref >60.00)
Glucose, Bld: 92 mg/dL (ref 70–99)
Potassium: 3.9 mEq/L (ref 3.5–5.1)
SODIUM: 140 meq/L (ref 135–145)
TOTAL PROTEIN: 7.4 g/dL (ref 6.0–8.3)

## 2015-11-22 LAB — LIPID PANEL
CHOLESTEROL: 167 mg/dL (ref 0–200)
HDL: 37.7 mg/dL — ABNORMAL LOW (ref >39.00)
LDL CALC: 91 mg/dL (ref 0–99)
NonHDL: 129.38
TRIGLYCERIDES: 190 mg/dL — AB (ref 0.0–149.0)
Total CHOL/HDL Ratio: 4
VLDL: 38 mg/dL (ref 0.0–40.0)

## 2015-11-29 ENCOUNTER — Ambulatory Visit (INDEPENDENT_AMBULATORY_CARE_PROVIDER_SITE_OTHER): Payer: BLUE CROSS/BLUE SHIELD | Admitting: Family Medicine

## 2015-11-29 ENCOUNTER — Encounter: Payer: Self-pay | Admitting: Family Medicine

## 2015-11-29 VITALS — BP 124/64 | HR 80 | Temp 98.3°F | Ht 75.5 in | Wt 198.0 lb

## 2015-11-29 DIAGNOSIS — Z820 Family history of epilepsy and other diseases of the nervous system: Secondary | ICD-10-CM | POA: Diagnosis not present

## 2015-11-29 NOTE — Progress Notes (Signed)
Pre visit review using our clinic review tool, if applicable. No additional management support is needed unless otherwise documented below in the visit note. 

## 2015-11-29 NOTE — Progress Notes (Signed)
The patient is here for annual wellness exam and preventative care.   Doing well overall.  Wife noted red line on forehead.Marland Kitchen Appears some times. Noted in wedding picture.  Has noted a few times since. He feel it may be emotional.   Sees dentist. Occ bad breath noted by wife.  Wife has noted  ? ADD symptoms.  Basically.Marland KitchenMarland KitchenHe fidgits a lot. No issue concentrating. No issues at work.  If he skips meals and is active.. he feels shaky, ill, nauseous. Eat he feels better.  Elevated Cholesterol:LDL at goal <130, but trigs high but improved from last year..with fish oil. Lab Results  Component Value Date   CHOL 167 11/22/2015   HDL 37.70* 11/22/2015   LDLCALC 91 11/22/2015   LDLDIRECT 95.0 11/20/2014   TRIG 190.0* 11/22/2015   CHOLHDL 4 11/22/2015   Body mass index is 24.41 kg/(m^2).  Exercise: Off and on. Diet: moderate Under a lot of stress at work this time of year.  Review of Systems  Constitutional: Negative for fever and fatigue.  HENT: Negative for ear pain.  Eyes: Negative for pain.  Respiratory: Negative for cough and shortness of breath.  Cardiovascular: Negative for chest pain, palpitations and leg swelling.  Gastrointestinal: Negative for abdominal pain. Occ rectal itching and chronic constipation. Genitourinary: Negative for dysuria.  Neurological: Negative for light-headedness.  Psychiatric/Behavioral: Negative for behavioral problems, confusion and dysphoric mood.      Objective:  Physical Exam  Constitutional: He appears well-developed and well-nourished. Non-toxic appearance. He does not appear ill. No distress.  HENT:  Head: Normocephalic and atraumatic.  Right Ear: Hearing, tympanic membrane, external ear and ear canal normal.  Left Ear: Hearing, tympanic membrane, external ear and ear canal normal.  Nose: Nose normal.  Mouth/Throat: Uvula is midline, oropharynx is clear and moist and mucous membranes are normal.  Eyes:  Conjunctivae, EOM and lids are normal. Pupils are equal, round, and reactive to light. Lids are everted and swept, no foreign bodies found.  Neck: Trachea normal, normal range of motion and phonation normal. Neck supple. Carotid bruit is not present. No mass and no thyromegaly present.  Cardiovascular: Normal rate, regular rhythm, S1 normal, S2 normal, intact distal pulses and normal pulses. Exam reveals no gallop.  No murmur heard. Pulmonary/Chest: Breath sounds normal. He has no wheezes. He has no rhonchi. He has no rales.  Abdominal: Soft. Normal appearance and bowel sounds are normal. There is no hepatosplenomegaly. There is no tenderness. There is no rebound, no guarding and no CVA tenderness. No hernia.  Genitourinary: not performed. Lymphadenopathy:   He has no cervical adenopathy.  Neurological: He is alert. He has normal strength and normal reflexes. No cranial nerve deficit or sensory deficit. Gait normal.  Skin: Skin is warm, dry and intact. No rash noted.  Psychiatric: He has a normal mood and affect. His speech is normal and behavior is normal. Judgment normal.    Assessment & Plan:  The patient's preventative maintenance and recommended screening tests for an annual wellness exam were reviewed in full today. Brought up to date unless services declined.  Counselled on the importance of diet, exercise, and its role in overall health and mortality. The patient's FH and SH was reviewed, including their home life, tobacco status, and drug and alcohol status.   Vaccines: Uptodate No prostate or colon cancer fam hx. Nonsmoker Refuses HIV testing.

## 2015-11-29 NOTE — Patient Instructions (Addendum)
Work on low fat diet and start regular exercise 3 times a week to icrease triglycerides and increase the HDL.

## 2016-06-19 ENCOUNTER — Ambulatory Visit (INDEPENDENT_AMBULATORY_CARE_PROVIDER_SITE_OTHER): Payer: BLUE CROSS/BLUE SHIELD

## 2016-06-19 ENCOUNTER — Encounter: Payer: Self-pay | Admitting: Podiatry

## 2016-06-19 ENCOUNTER — Ambulatory Visit (INDEPENDENT_AMBULATORY_CARE_PROVIDER_SITE_OTHER): Payer: BLUE CROSS/BLUE SHIELD | Admitting: Podiatry

## 2016-06-19 ENCOUNTER — Ambulatory Visit: Payer: Self-pay

## 2016-06-19 VITALS — BP 126/70 | HR 68 | Resp 16 | Ht 76.0 in | Wt 200.0 lb

## 2016-06-19 DIAGNOSIS — M722 Plantar fascial fibromatosis: Secondary | ICD-10-CM | POA: Diagnosis not present

## 2016-06-19 DIAGNOSIS — M79671 Pain in right foot: Secondary | ICD-10-CM

## 2016-06-19 DIAGNOSIS — M79672 Pain in left foot: Secondary | ICD-10-CM

## 2016-06-19 MED ORDER — DICLOFENAC SODIUM 75 MG PO TBEC: 75.0000 mg | DELAYED_RELEASE_TABLET | Freq: Two times a day (BID) | ORAL | 2 refills | Status: DC

## 2016-06-19 MED ORDER — TRIAMCINOLONE ACETONIDE 10 MG/ML IJ SUSP
10.0000 mg | Freq: Once | INTRAMUSCULAR | Status: AC
  Administered 2016-06-19: 10 mg

## 2016-06-19 NOTE — Progress Notes (Signed)
   Subjective:    Patient ID: Ray Park, male    DOB: 08-26-1971, 45 y.o.   MRN: [id number]  HPI  Chief Complaint  Patient presents with  . Foot Pain    bilateral heel pain with the L being the worst x 3 mo.  Pt reports hx of P/F yrs ago.         Review of Systems  All other systems reviewed and are negative.      Objective:   Physical Exam        Assessment & Plan:

## 2016-06-21 NOTE — Progress Notes (Signed)
Subjective:     Patient ID: Ray Park, male   DOB: May 19, 1971, 45 y.o.   MRN: [id number]  HPI patient presents stating I'm still getting pain in my heels but it seems that it's gradually getting worse over the last few months   Review of Systems  All other systems reviewed and are negative.      Objective:   Physical Exam  Constitutional: He is oriented to person, place, and time.  Cardiovascular: Intact distal pulses.   Musculoskeletal: Normal range of motion.  Neurological: He is oriented to person, place, and time.  Skin: Skin is warm.  Nursing note and vitals reviewed.  neurovascular status intact muscle strength adequate range of motion within normal limits with patient noted to have quite a bit of discomfort in the plantar heel region bilateral with fluid buildup around the medial band. Patient's noted to have good digital perfusion is well oriented 3 with moderate depression of the arch     Assessment:     Acute plantar fasciitis bilateral of several months duration    Plan:     H&P x-rays reviewed and reinjected the plantar fascia bilateral 3 mg Kenalog 5 mg Xylocaine and advised on physical therapy supportive shoe gear usage. Reappoint to recheck and also dispensed fascial brace is  X-ray report indicates no indications of spur with moderate high arch type with possibility this patient will need orthotics long-term

## 2016-07-03 ENCOUNTER — Encounter: Payer: Self-pay | Admitting: Podiatry

## 2016-07-03 ENCOUNTER — Ambulatory Visit (INDEPENDENT_AMBULATORY_CARE_PROVIDER_SITE_OTHER): Payer: BLUE CROSS/BLUE SHIELD | Admitting: Podiatry

## 2016-07-03 DIAGNOSIS — M79671 Pain in right foot: Secondary | ICD-10-CM | POA: Diagnosis not present

## 2016-07-03 DIAGNOSIS — M79672 Pain in left foot: Secondary | ICD-10-CM

## 2016-07-03 DIAGNOSIS — M722 Plantar fascial fibromatosis: Secondary | ICD-10-CM

## 2016-07-05 NOTE — Progress Notes (Signed)
Subjective:     Patient ID: Ray Park, male   DOB: Dec 22, 1970, 45 y.o.   MRN: [id number]  HPI patient states I'm improved with my heels but I've had this for a long time and it had a tendency for reoccurrence   Review of Systems     Objective:   Physical Exam Neurovascular status intact muscle strength adequate with discomfort in the plantar heel region bilateral that's improved but still is present when palpated deeply    Assessment:     Plantar fasciitis bilateral improved but present    Plan:     Advised on physical therapy anti-inflammatories and utilization of long-term support. I went ahead scanned for customized orthotics to reduce plantar pressure against the heels and arch and will be seen back when ready

## 2016-07-29 ENCOUNTER — Ambulatory Visit (INDEPENDENT_AMBULATORY_CARE_PROVIDER_SITE_OTHER): Payer: BLUE CROSS/BLUE SHIELD | Admitting: Podiatry

## 2016-07-29 DIAGNOSIS — M722 Plantar fascial fibromatosis: Secondary | ICD-10-CM

## 2016-07-29 NOTE — Progress Notes (Signed)
Subjective:     Patient ID: Ray Park, male   DOB: 19-Jul-1971, 45 y.o.   MRN: [id number]  HPI patient states doing well   Review of Systems     Objective:   Physical Exam Neurovascular status intact fasciitis symptoms    Assessment:     Plantar fasciitis improving    Plan:     Orthotics dispensed with instructions

## 2016-07-29 NOTE — Patient Instructions (Signed)

## 2016-10-02 ENCOUNTER — Encounter: Payer: Self-pay | Admitting: Family Medicine

## 2016-10-02 ENCOUNTER — Ambulatory Visit (INDEPENDENT_AMBULATORY_CARE_PROVIDER_SITE_OTHER): Payer: BLUE CROSS/BLUE SHIELD | Admitting: Family Medicine

## 2016-10-02 VITALS — BP 114/60 | HR 66 | Temp 98.2°F | Wt 204.5 lb

## 2016-10-02 DIAGNOSIS — R21 Rash and other nonspecific skin eruption: Secondary | ICD-10-CM

## 2016-10-02 MED ORDER — TRIAMCINOLONE ACETONIDE 0.5 % EX CREA: 1.0000 "application " | TOPICAL_CREAM | Freq: Two times a day (BID) | CUTANEOUS | 0 refills | Status: DC

## 2016-10-02 NOTE — Patient Instructions (Signed)
Pityriasis rosea vs nummular eczema.  treat itching with twice Daily topical steroid over rash.  Expect 1-2 months further to resolve if PR.  Pityriasis Rosea Introduction Pityriasis rosea is a rash that usually appears on the trunk of the body. It may also appear on the upper arms and upper legs. It usually begins as a single patch, and then more patches begin to develop. The rash may cause mild itching, but it normally does not cause other problems. It usually goes away without treatment. However, it may take weeks or months for the rash to go away completely. What are the causes? The cause of this condition is not known. The condition does not spread from person to person (is noncontagious). What increases the risk? This condition is more likely to develop in young adults and children. It is most common in the spring and fall. What are the signs or symptoms? The main symptom of this condition is a rash.  The rash usually begins with a single oval patch that is larger than the ones that follow. This is called a herald patch. It generally appears a week or more before the rest of the rash appears.  When more patches start to develop, they spread quickly on the trunk, back, and arms. These patches are smaller than the first one.  The patches that make up the rash are usually oval-shaped and pink or red in color. They are usually flat, but they may sometimes be raised so that they can be felt with a finger. They may also be finely crinkled and have a scaly ring around the edge.  The rash does not typically appear on areas of the skin that are exposed to the sun. Most people who have this condition do not have other symptoms, but some have mild itching. In a few cases, a mild headache or body aches may occur before the rash appears and then go away. How is this diagnosed? Your health care provider may diagnose this condition by doing a physical exam and taking your medical history. To rule out  other possible causes for the rash, the health care provider may order blood tests or take a skin sample from the rash to be looked at under a microscope. How is this treated? Usually, treatment is not needed for this condition. The rash will probably go away on its own in 4-8 weeks. In some cases, a health care provider may recommend or prescribe medicine to reduce itching. Follow these instructions at home:  Take medicines only as directed by your health care provider.  Avoid scratching the affected areas of skin.  Do not take hot baths or use a sauna. Use only warm water when bathing or showering. Heat can increase itching. Contact a health care provider if:  Your rash gets much worse.  You have a fever.  You have swelling or pain in the rash area.  You have fluid, blood, or pus coming from the rash area. This information is not intended to replace advice given to you by your health care provider. Make sure you discuss any questions you have with your health care provider. Document Released: 11/04/2001 Document Revised: 03/05/2016 Document Reviewed: 09/05/2014  2017 Elsevier

## 2016-10-02 NOTE — Assessment & Plan Note (Signed)
Pt rosea vs nummular eczema. Treat with topical steroid . Info provided on possible course.

## 2016-10-02 NOTE — Progress Notes (Signed)
   Subjective:    Patient ID: Ray Park, male    DOB: 22-Sep-1971, 45 y.o.   MRN: [id number]  HPI   45 year old male presents with new onset rash on torso x 1 months.  Initially not itchy. No blisters , no pustules just red bump.  No new exposures, no new meds.  2 months ago new shampoo. Stopped and no improvement.   Now rash spread to back and now more itchy,  Small area on right arm and left leg.  No fever, no SOB, no lip swelling. No viral URI symptoms.  She has tried hydrocortisone x 1 day.. Didn't help much. No family with rash, no sick contacts.  No history of skin issues.     Review of Systems  Constitutional: Negative for fatigue.  HENT: Negative for ear pain.   Eyes: Negative for pain.  Respiratory: Negative for shortness of breath.   Cardiovascular: Negative for chest pain.       Objective:   Physical Exam  Constitutional: Vital signs are normal. He appears well-developed and well-nourished.  HENT:  Head: Normocephalic.  Right Ear: Hearing normal.  Left Ear: Hearing normal.  Nose: Nose normal.  Mouth/Throat: Oropharynx is clear and moist and mucous membranes are normal.  Neck: Trachea normal. Carotid bruit is not present. No thyroid mass and no thyromegaly present.  Cardiovascular: Normal rate, regular rhythm and normal pulses.  Exam reveals no gallop, no distant heart sounds and no friction rub.   No murmur heard. No peripheral edema  Pulmonary/Chest: Effort normal and breath sounds normal. No respiratory distress.  Skin: Skin is warm, dry and intact. No rash noted.  Salmon colored patches on upper chest and back.. No clear herald patch, but some larger patches Fine scale  Psychiatric: He has a normal mood and affect. His speech is normal and behavior is normal. Thought content normal.          Assessment & Plan:

## 2016-11-24 ENCOUNTER — Telehealth: Payer: Self-pay | Admitting: Family Medicine

## 2016-11-24 DIAGNOSIS — E7800 Pure hypercholesterolemia, unspecified: Secondary | ICD-10-CM

## 2016-11-24 NOTE — Telephone Encounter (Signed)
-----   Message from Alvina Chouerri J Walsh sent at 11/10/2016  3:22 PM EST ----- Regarding: Lab orders for Wednesday, 2.14.18 Patient is scheduled for CPX labs, please order future labs, Thanks , Camelia Engerri

## 2016-11-25 ENCOUNTER — Other Ambulatory Visit (INDEPENDENT_AMBULATORY_CARE_PROVIDER_SITE_OTHER): Payer: BLUE CROSS/BLUE SHIELD

## 2016-11-25 DIAGNOSIS — E7800 Pure hypercholesterolemia, unspecified: Secondary | ICD-10-CM

## 2016-11-25 LAB — COMPREHENSIVE METABOLIC PANEL
ALT: 23 U/L (ref 0–53)
AST: 20 U/L (ref 0–37)
Albumin: 4.6 g/dL (ref 3.5–5.2)
Alkaline Phosphatase: 61 U/L (ref 39–117)
BILIRUBIN TOTAL: 0.5 mg/dL (ref 0.2–1.2)
BUN: 18 mg/dL (ref 6–23)
CALCIUM: 9.5 mg/dL (ref 8.4–10.5)
CO2: 31 meq/L (ref 19–32)
CREATININE: 1.14 mg/dL (ref 0.40–1.50)
Chloride: 106 mEq/L (ref 96–112)
GFR: 73 mL/min (ref >60.00)
GLUCOSE: 94 mg/dL (ref 70–99)
Potassium: 3.9 mEq/L (ref 3.5–5.1)
Sodium: 140 mEq/L (ref 135–145)
Total Protein: 6.9 g/dL (ref 6.0–8.3)

## 2016-11-25 LAB — LIPID PANEL
CHOL/HDL RATIO: 5
Cholesterol: 178 mg/dL (ref 0–200)
HDL: 37.5 mg/dL — ABNORMAL LOW (ref >39.00)
LDL Cholesterol: 102 mg/dL — ABNORMAL HIGH (ref 0–99)
NONHDL: 140.86
TRIGLYCERIDES: 194 mg/dL — AB (ref 0.0–149.0)
VLDL: 38.8 mg/dL (ref 0.0–40.0)

## 2016-12-01 ENCOUNTER — Encounter: Payer: Self-pay | Admitting: Family Medicine

## 2016-12-01 ENCOUNTER — Ambulatory Visit (INDEPENDENT_AMBULATORY_CARE_PROVIDER_SITE_OTHER): Payer: BLUE CROSS/BLUE SHIELD | Admitting: Family Medicine

## 2016-12-01 VITALS — BP 118/60 | HR 66 | Temp 98.3°F | Ht 76.0 in | Wt 202.2 lb

## 2016-12-01 DIAGNOSIS — E781 Pure hyperglyceridemia: Secondary | ICD-10-CM | POA: Diagnosis not present

## 2016-12-01 DIAGNOSIS — E7800 Pure hypercholesterolemia, unspecified: Secondary | ICD-10-CM

## 2016-12-01 DIAGNOSIS — R41840 Attention and concentration deficit: Secondary | ICD-10-CM

## 2016-12-01 DIAGNOSIS — Z0000 Encounter for general adult medical examination without abnormal findings: Secondary | ICD-10-CM | POA: Diagnosis not present

## 2016-12-01 NOTE — Patient Instructions (Addendum)
Work on increasing exercise and decreasing fat, fried foods in diet.  Stop at front desk for referral to psychiatry for ADD eval.

## 2016-12-01 NOTE — Progress Notes (Signed)
Pre visit review using our clinic review tool, if applicable. No additional management support is needed unless otherwise documented below in the visit note. 

## 2016-12-01 NOTE — Assessment & Plan Note (Signed)
HDL too low, Trig high. Encouraged exercise, weight loss, healthy eating habits.  No indication for statin currently.

## 2016-12-01 NOTE — Progress Notes (Signed)
Subjective:    Patient ID: Ray Park, male    DOB: [birth year], 46 y.o.   MRN: [id number]  HPI  The patient is here for annual wellness exam and preventative care.     He feels well overall.  1 month ago had URI.Marland Kitchen Feels well now but still with lingering dry cough. No fever. No SOB, no wheeze.  BP Readings from Last 3 Encounters:  12/01/16 118/60  10/02/16 114/60  06/19/16 126/70   High cholesterol: AHA 2.7%  10 year risk.. No indication for statin. Lab Results  Component Value Date   CHOL 178 11/25/2016   HDL 37.50 (L) 11/25/2016   LDLCALC 102 (H) 11/25/2016   LDLDIRECT 95.0 11/20/2014   TRIG 194.0 (H) 11/25/2016   CHOLHDL 5 11/25/2016  Using medications without problems: Muscle aches:  Diet compliance: moderate Exercise:  off and on, walks at work Other complaints:  His wife worries about him having ADD.Marland Kitchen He requests referral to psychiatry for re-eval. He has no issues at work.  Fam Hx alzheimers, but he is high functioning at work.  No personal hx of ADD.  Social History /Family History/Past Medical History reviewed and updated if needed. Blood pressure 118/60, pulse 66, temperature 98.3 F (36.8 C), temperature source Oral, height 6\' 4"  (1.93 m), weight 202 lb 4 oz (91.7 kg).   Review of Systems  Constitutional: Negative for fatigue and fever.  HENT: Negative for ear pain.   Eyes: Negative for pain.  Respiratory: Negative for cough and shortness of breath.   Cardiovascular: Negative for chest pain, palpitations and leg swelling.  Gastrointestinal: Negative for abdominal pain.  Genitourinary: Negative for dysuria.  Musculoskeletal: Negative for arthralgias.  Neurological: Negative for syncope, light-headedness and headaches.  Psychiatric/Behavioral: Negative for dysphoric mood.       Objective:   Physical Exam  Constitutional: He is oriented to person, place, and time. He appears well-developed and well-nourished.  Non-toxic appearance. He does not  appear ill. No distress.  HENT:  Head: Normocephalic and atraumatic.  Right Ear: Hearing, tympanic membrane, external ear and ear canal normal.  Left Ear: Hearing, tympanic membrane, external ear and ear canal normal.  Nose: Nose normal.  Mouth/Throat: Uvula is midline, oropharynx is clear and moist and mucous membranes are normal.  Eyes: Conjunctivae, EOM and lids are normal. Pupils are equal, round, and reactive to light. Lids are everted and swept, no foreign bodies found.  Neck: Trachea normal, normal range of motion and phonation normal. Neck supple. Carotid bruit is not present. No thyroid mass and no thyromegaly present.  Cardiovascular: Normal rate, regular rhythm, S1 normal, S2 normal, intact distal pulses and normal pulses.  Exam reveals no gallop.   No murmur heard. Pulmonary/Chest: Breath sounds normal. He has no wheezes. He has no rhonchi. He has no rales.  Abdominal: Soft. Normal appearance and bowel sounds are normal. There is no hepatosplenomegaly. There is no tenderness. There is no rebound, no guarding and no CVA tenderness. No hernia.  Lymphadenopathy:    He has no cervical adenopathy.  Neurological: He is alert and oriented to person, place, and time. He has normal strength and normal reflexes. No cranial nerve deficit or sensory deficit. Gait normal. GCS eye subscore is 4. GCS verbal subscore is 5. GCS motor subscore is 6.  Skin: Skin is warm, dry and intact. No rash noted.  Psychiatric: He has a normal mood and affect. His speech is normal and behavior is normal. Judgment normal.  Assessment & Plan:  The patient's preventative maintenance and recommended screening tests for an annual wellness exam were reviewed in full today. Brought up to date unless services declined.  Counselled on the importance of diet, exercise, and its role in overall health and mortality. The patient's FH and SH was reviewed, including their home life, tobacco status, and drug and  alcohol status.    Vaccines: Uptodate with tdap and flu. Prostate Cancer Screen: No family hx Colon Cancer Screen:  No family hx      Smoking Status: none ETOH/ drug use:  Every few weeks/none  HIV screen:  none

## 2016-12-01 NOTE — Assessment & Plan Note (Signed)
HDL too low, Trig high. Encouraged exercise, weight loss, healthy eating habits.  No indication for statin currently.  

## 2017-09-13 ENCOUNTER — Encounter: Payer: Self-pay | Admitting: Family Medicine

## 2017-09-13 ENCOUNTER — Ambulatory Visit: Payer: BLUE CROSS/BLUE SHIELD | Admitting: Family Medicine

## 2017-09-13 VITALS — BP 124/78 | HR 72 | Temp 98.2°F | Ht 76.0 in | Wt 203.0 lb

## 2017-09-13 DIAGNOSIS — J069 Acute upper respiratory infection, unspecified: Secondary | ICD-10-CM

## 2017-09-13 NOTE — Patient Instructions (Signed)
For nasal congestion you can use Afrin nasal spray for 3 days max, Sudafed, saline nasal spray (generic is fine for all). For cough you can try Delsym. Drink enough fluids to make your urine light yellow. For fever/chill/muscle aches you can take over the counter acetaminophen or ibuprofen.  Please come back in if you are not better in 5-7 days or if you develop wheezing, shortness of breath or persistent vomiting.   

## 2017-09-13 NOTE — Progress Notes (Signed)
Subjective:    Patient ID: Ray Park, male    DOB: April 29, 1971, 46 y.o.   MRN: [id number]  HPI This is a 46 yo male who presents today with one week of sinus pressure, headache, nasal congestion, cough with yellow green sputum. Doesn't feel bad, congestion worse at night.  Using steam treatments, vitamins and hot tea. Clears throat now and then. No SOB or wheeze. Sleeping poorly, having to blow his nose.  Has seasonal allergies, usually in the spring.    Past Medical History:  Diagnosis Date  . Pure hypercholesterolemia   . Pure hyperglyceridemia    Past Surgical History:  Procedure Laterality Date  . ACL replacement  2001  . US ECHOCARDIOGRAPHY     No MVP; benign murmur   Family History  Problem Relation Age of Onset  . Hypertension Father   . Diabetes Father   . Alzheimer's disease Mother        Early onset 39(60's)  . Alzheimer's disease Maternal Grandfather   . Alzheimer's disease Maternal Aunt    Social History   Tobacco Use  . Smoking status: Never Smoker  . Smokeless tobacco: Never Used  Substance Use Topics  . Alcohol use: Yes    Comment: Rare  . Drug use: No      Review of Systems Per HPI    Objective:   Physical Exam  Constitutional: He is oriented to person, place, and time. He appears well-developed and well-nourished. No distress.  HENT:  Head: Normocephalic and atraumatic.  Right Ear: Tympanic membrane, external ear and ear canal normal.  Left Ear: Tympanic membrane, external ear and ear canal normal.  Nose: Mucosal edema present. Right sinus exhibits no maxillary sinus tenderness and no frontal sinus tenderness. Left sinus exhibits no frontal sinus tenderness.  Mouth/Throat: Uvula is midline and mucous membranes are normal. No oropharyngeal exudate, posterior oropharyngeal edema or posterior oropharyngeal erythema.  Sounds congested. Visible post nasal drainge.   Eyes: Conjunctivae are normal.  Neck: Normal range of motion. Neck supple.    Cardiovascular: Normal rate, regular rhythm and normal heart sounds.  Pulmonary/Chest: Effort normal and breath sounds normal.  Lymphadenopathy:    He has no cervical adenopathy.  Neurological: He is alert and oriented to person, place, and time.  Skin: Skin is warm and dry. He is not diaphoretic.  Psychiatric: He has a normal mood and affect. His behavior is normal. Judgment and thought content normal.  Vitals reviewed.     BP 124/78   Pulse 72   Temp 98.2 F (36.8 C) (Oral)   Ht 6\' 4"  (1.93 m)   Wt 203 lb (92.1 kg)   SpO2 98%   BMI 24.71 kg/m  Wt Readings from Last 3 Encounters:  09/13/17 203 lb (92.1 kg)  12/01/16 202 lb 4 oz (91.7 kg)  10/02/16 204 lb 8 oz (92.8 kg)       Assessment & Plan:  1. Viral URI - Patient Instructions  For nasal congestion you can use Afrin nasal spray for 3 days max, Sudafed, saline nasal spray (generic is fine for all). For cough you can try Delsym. Drink enough fluids to make your urine light yellow. For fever/chill/muscle aches you can take over the counter acetaminophen or ibuprofen.  Please come back in if you are not better in 5-7 days or if you develop wheezing, shortness of breath or persistent vomiting.    Olean Reeeborah Gessner, FNP-BC   [facility], [facility]  09/13/2017 9:31 AM

## 2017-12-08 ENCOUNTER — Telehealth: Payer: Self-pay | Admitting: Family Medicine

## 2017-12-08 DIAGNOSIS — E781 Pure hyperglyceridemia: Secondary | ICD-10-CM

## 2017-12-08 NOTE — Telephone Encounter (Signed)
-----   Message from Alvina Chouerri J Walsh sent at 12/02/2017  8:49 AM EST ----- Regarding: Lab orders for Thursday, 2.28.19 Patient is scheduled for CPX labs, please order future labs, Thanks , Camelia Engerri

## 2017-12-09 ENCOUNTER — Other Ambulatory Visit (INDEPENDENT_AMBULATORY_CARE_PROVIDER_SITE_OTHER): Payer: BLUE CROSS/BLUE SHIELD

## 2017-12-09 DIAGNOSIS — E781 Pure hyperglyceridemia: Secondary | ICD-10-CM | POA: Diagnosis not present

## 2017-12-09 LAB — LIPID PANEL
CHOLESTEROL: 164 mg/dL (ref 0–200)
HDL: 36.8 mg/dL — ABNORMAL LOW (ref >39.00)
LDL CALC: 104 mg/dL — AB (ref 0–99)
NonHDL: 127.67
Total CHOL/HDL Ratio: 4
Triglycerides: 117 mg/dL (ref 0.0–149.0)
VLDL: 23.4 mg/dL (ref 0.0–40.0)

## 2017-12-09 LAB — COMPREHENSIVE METABOLIC PANEL
ALBUMIN: 4.2 g/dL (ref 3.5–5.2)
ALT: 25 U/L (ref 0–53)
AST: 20 U/L (ref 0–37)
Alkaline Phosphatase: 60 U/L (ref 39–117)
BUN: 21 mg/dL (ref 6–23)
CHLORIDE: 104 meq/L (ref 96–112)
CO2: 31 mEq/L (ref 19–32)
Calcium: 9.9 mg/dL (ref 8.4–10.5)
Creatinine, Ser: 1.22 mg/dL (ref 0.40–1.50)
GFR: 67.21 mL/min (ref >60.00)
Glucose, Bld: 92 mg/dL (ref 70–99)
POTASSIUM: 4 meq/L (ref 3.5–5.1)
SODIUM: 140 meq/L (ref 135–145)
Total Bilirubin: 0.5 mg/dL (ref 0.2–1.2)
Total Protein: 7.2 g/dL (ref 6.0–8.3)

## 2017-12-16 ENCOUNTER — Ambulatory Visit (INDEPENDENT_AMBULATORY_CARE_PROVIDER_SITE_OTHER): Payer: BLUE CROSS/BLUE SHIELD | Admitting: Family Medicine

## 2017-12-16 ENCOUNTER — Encounter: Payer: Self-pay | Admitting: Family Medicine

## 2017-12-16 ENCOUNTER — Other Ambulatory Visit: Payer: Self-pay

## 2017-12-16 VITALS — BP 110/80 | HR 63 | Temp 98.6°F | Ht 75.5 in | Wt 203.5 lb

## 2017-12-16 DIAGNOSIS — Z0000 Encounter for general adult medical examination without abnormal findings: Secondary | ICD-10-CM

## 2017-12-16 DIAGNOSIS — M25511 Pain in right shoulder: Secondary | ICD-10-CM | POA: Diagnosis not present

## 2017-12-16 DIAGNOSIS — Z23 Encounter for immunization: Secondary | ICD-10-CM | POA: Diagnosis not present

## 2017-12-16 DIAGNOSIS — E781 Pure hyperglyceridemia: Secondary | ICD-10-CM | POA: Diagnosis not present

## 2017-12-16 DIAGNOSIS — G8929 Other chronic pain: Secondary | ICD-10-CM

## 2017-12-16 NOTE — Addendum Note (Signed)
Addended by: Damita LackLORING, DONNA S on: 12/16/2017 10:59 AM   Modules accepted: Orders

## 2017-12-16 NOTE — Assessment & Plan Note (Signed)
Likely mild tendonitis. Treat with home PT.

## 2017-12-16 NOTE — Progress Notes (Signed)
Subjective:    Patient ID: Ray Park, male    DOB: September 17, 1971, 47 y.o.   MRN: [id number]  HPI  The patient is here for annual wellness exam and preventative care.    Elevated Cholesterol:   Lab Results  Component Value Date   CHOL 164 12/09/2017   HDL 36.80 (L) 12/09/2017   LDLCALC 104 (H) 12/09/2017   LDLDIRECT 95.0 11/20/2014   TRIG 117.0 12/09/2017   CHOLHDL 4 12/09/2017  Using medications without problems: Muscle aches:  Diet compliance: healthy diet, less fast food. Exercise: minimal, walking at work Other complaints:  He has mild pain in right shoulder x 1 year, mainly with abduction.. No numbness, no weakness. No neck pain  no fall, but more sore with a lot of lifting. Not using any medication   Social History /Family History/Past Medical History reviewed in detail and updated in EMR if needed. Blood pressure 110/80, pulse 63, temperature 98.6 F (37 C), temperature source Oral, height 6' 3.5" (1.918 m), weight 203 lb 8 oz (92.3 kg).  Body mass index is 25.1 kg/m.   Wt Readings from Last 3 Encounters:  12/16/17 203 lb 8 oz (92.3 kg)  09/13/17 203 lb (92.1 kg)  12/01/16 202 lb 4 oz (91.7 kg)     Review of Systems  Constitutional: Negative for fatigue and fever.  HENT: Positive for nosebleeds. Negative for ear pain.        Nosebleed associated with dry air, lasted only 1 minute.  no easy bruising or gum bleeding.   Eyes: Negative for pain.  Respiratory: Negative for cough and shortness of breath.   Cardiovascular: Negative for chest pain, palpitations and leg swelling.  Gastrointestinal: Negative for abdominal pain.  Genitourinary: Negative for dysuria.  Musculoskeletal: Negative for arthralgias.  Neurological: Negative for syncope, light-headedness and headaches.  Psychiatric/Behavioral: Negative for dysphoric mood.       Objective:   Physical Exam  Constitutional: He appears well-developed and well-nourished.  Non-toxic appearance. He does not  appear ill. No distress.  HENT:  Head: Normocephalic and atraumatic.  Right Ear: Hearing, tympanic membrane, external ear and ear canal normal.  Left Ear: Hearing, tympanic membrane, external ear and ear canal normal.  Nose: Nose normal.  Mouth/Throat: Uvula is midline, oropharynx is clear and moist and mucous membranes are normal.  Eyes: Conjunctivae, EOM and lids are normal. Pupils are equal, round, and reactive to light. Lids are everted and swept, no foreign bodies found.  Neck: Trachea normal, normal range of motion and phonation normal. Neck supple. Carotid bruit is not present. No thyroid mass and no thyromegaly present.  Cardiovascular: Normal rate, regular rhythm, S1 normal, S2 normal, intact distal pulses and normal pulses. Exam reveals no gallop.  No murmur heard. Pulmonary/Chest: Breath sounds normal. He has no wheezes. He has no rhonchi. He has no rales.  Abdominal: Soft. Normal appearance and bowel sounds are normal. There is no hepatosplenomegaly. There is no tenderness. There is no rebound, no guarding and no CVA tenderness. No hernia.  Musculoskeletal:       Right shoulder: He exhibits normal range of motion and no tenderness.       Left shoulder: Normal.  Mild pain with abductiuon and int rotation  neg drop arma nd neg neers  Lymphadenopathy:    He has no cervical adenopathy.  Neurological: He is alert. He has normal strength and normal reflexes. No cranial nerve deficit or sensory deficit. Gait normal.  Skin: Skin is warm, dry and intact.  No rash noted.   Nodule left thumb DIP joint, hyperpigmented lesion on right forearm, clogged pore under right eye.  Psychiatric: He has a normal mood and affect. His speech is normal and behavior is normal. Judgment normal.          Assessment & Plan:  The patient's preventative maintenance and recommended screening tests for an annual wellness exam were reviewed in full today. Brought up to date unless services  declined.  Counselled on the importance of diet, exercise, and its role in overall health and mortality. The patient's FH and SH was reviewed, including their home life, tobacco status, and drug and alcohol status.   Vaccines: refused tetanus vaccine. Prostate Cancer Screen: No family hx Colon Cancer Screen:  No family hx      Smoking Status: none ETOH/ drug use:  Every few weeks/none HIV screen: none

## 2017-12-16 NOTE — Assessment & Plan Note (Signed)
Improved with lifestyle change. 

## 2017-12-16 NOTE — Patient Instructions (Addendum)
Call if you need a dermatology referral.  Increase exercise and continue working on healthy eating. Start right shouder home physical therapy.

## 2018-11-23 ENCOUNTER — Ambulatory Visit (INDEPENDENT_AMBULATORY_CARE_PROVIDER_SITE_OTHER): Payer: BLUE CROSS/BLUE SHIELD | Admitting: Family Medicine

## 2018-11-23 ENCOUNTER — Encounter: Payer: Self-pay | Admitting: Family Medicine

## 2018-11-23 ENCOUNTER — Other Ambulatory Visit: Payer: Self-pay

## 2018-11-23 VITALS — BP 108/70 | HR 87 | Temp 98.7°F | Resp 16 | Ht 75.5 in | Wt 202.4 lb

## 2018-11-23 DIAGNOSIS — R6889 Other general symptoms and signs: Secondary | ICD-10-CM | POA: Diagnosis not present

## 2018-11-23 MED ORDER — OSELTAMIVIR PHOSPHATE 75 MG PO CAPS: 75.0000 mg | ORAL_CAPSULE | Freq: Two times a day (BID) | ORAL | 0 refills | Status: DC

## 2018-11-23 NOTE — Progress Notes (Signed)
Subjective:    Patient ID: Ray Park, male    DOB: [birth year], 48 y.o.   MRN: [id number]  HPI This is a 48 yo male who presents today with fever, nasal congestion and cough x 2 days. Had flu vaccine this season. Wife with influenza. His symptoms started with runny nose, nasal congestion, non productive cough. Fever was 101 this morning. Has taken Chloracedin and acetaminophen (last dose this am). He requests Tamiflu given contact and symptoms. He has not had SOB or wheeze.   Past Medical History:  Diagnosis Date  . Pure hypercholesterolemia   . Pure hyperglyceridemia    Past Surgical History:  Procedure Laterality Date  . ACL replacement  2001  . US ECHOCARDIOGRAPHY     No MVP; benign murmur   Family History  Problem Relation Age of Onset  . Hypertension Father   . Diabetes Father   . Alzheimer's disease Mother        Early onset (29's)  . Alzheimer's disease Maternal Grandfather   . Alzheimer's disease Maternal Aunt    Social History   Tobacco Use  . Smoking status: Never Smoker  . Smokeless tobacco: Never Used  Substance Use Topics  . Alcohol use: Yes    Comment: Rare  . Drug use: No      Review of Systems Per HPI    Objective:   Physical Exam Vitals signs reviewed.  Constitutional:      General: He is not in acute distress.    Appearance: Normal appearance. He is normal weight. He is not ill-appearing, toxic-appearing or diaphoretic.  HENT:     Head: Normocephalic and atraumatic.     Right Ear: Tympanic membrane, ear canal and external ear normal.     Left Ear: Tympanic membrane, ear canal and external ear normal.     Nose: Congestion and rhinorrhea present.     Mouth/Throat:     Mouth: Mucous membranes are moist.     Pharynx: Oropharynx is clear.  Eyes:     Conjunctiva/sclera: Conjunctivae normal.  Neck:     Musculoskeletal: Normal range of motion and neck supple. No neck rigidity or muscular tenderness.  Cardiovascular:     Rate and Rhythm:  Normal rate and regular rhythm.     Heart sounds: Normal heart sounds.  Pulmonary:     Effort: Pulmonary effort is normal.     Breath sounds: Normal breath sounds.  Lymphadenopathy:     Cervical: No cervical adenopathy.  Skin:    General: Skin is warm and dry.  Neurological:     Mental Status: He is alert and oriented to person, place, and time.  Psychiatric:        Mood and Affect: Mood normal.        Behavior: Behavior normal.        Thought Content: Thought content normal.        Judgment: Judgment normal.       BP 108/70 (BP Location: Left Arm, Patient Position: Sitting, Cuff Size: Normal)   Pulse 87   Temp 98.7 F (37.1 C) (Oral)   Resp 16   Ht 6' 3.5" (1.918 m)   Wt 202 lb 6.4 oz (91.8 kg)   SpO2 96%   BMI 24.96 kg/m  Wt Readings from Last 3 Encounters:  11/23/18 202 lb 6.4 oz (91.8 kg)  12/16/17 203 lb 8 oz (92.3 kg)  09/13/17 203 lb (92.1 kg)   .    Assessment & Plan:  1. Flu-like  symptoms - given close contact with influenza and fever, will treat with oseltamivir - potential side effects reviewed - Provided written and verbal information regarding diagnosis and treatment. - he was instructed to continue symptomatic treatment, increase fluids, rest, RTC precautions reviewed - oseltamivir (TAMIFLU) 75 MG capsule; Take 1 capsule (75 mg total) by mouth 2 (two) times daily.  Dispense: 10 capsule; Refill: 0   Olean Ree, FNP-BC  [place] [facility] at [facility], [facility]  11/24/2018 6:52 AM

## 2018-11-23 NOTE — Patient Instructions (Signed)

## 2019-01-31 ENCOUNTER — Other Ambulatory Visit: Payer: BLUE CROSS/BLUE SHIELD

## 2019-02-07 ENCOUNTER — Encounter: Payer: BLUE CROSS/BLUE SHIELD | Admitting: Family Medicine

## 2019-03-30 ENCOUNTER — Other Ambulatory Visit: Payer: BLUE CROSS/BLUE SHIELD

## 2019-04-06 ENCOUNTER — Encounter: Payer: BLUE CROSS/BLUE SHIELD | Admitting: Family Medicine

## 2019-04-13 ENCOUNTER — Other Ambulatory Visit (INDEPENDENT_AMBULATORY_CARE_PROVIDER_SITE_OTHER): Payer: BC Managed Care – PPO

## 2019-04-13 ENCOUNTER — Other Ambulatory Visit: Payer: Self-pay

## 2019-04-13 ENCOUNTER — Telehealth: Payer: Self-pay | Admitting: Family Medicine

## 2019-04-13 DIAGNOSIS — E781 Pure hyperglyceridemia: Secondary | ICD-10-CM | POA: Diagnosis not present

## 2019-04-13 LAB — COMPREHENSIVE METABOLIC PANEL
ALT: 18 U/L (ref 0–53)
AST: 17 U/L (ref 0–37)
Albumin: 4.6 g/dL (ref 3.5–5.2)
Alkaline Phosphatase: 68 U/L (ref 39–117)
BUN: 17 mg/dL (ref 6–23)
CO2: 30 mEq/L (ref 19–32)
Calcium: 9.1 mg/dL (ref 8.4–10.5)
Chloride: 105 mEq/L (ref 96–112)
Creatinine, Ser: 1.09 mg/dL (ref 0.40–1.50)
GFR: 71.62 mL/min (ref >60.00)
Glucose, Bld: 122 mg/dL — ABNORMAL HIGH (ref 70–99)
Potassium: 4.1 mEq/L (ref 3.5–5.1)
Sodium: 141 mEq/L (ref 135–145)
Total Bilirubin: 0.4 mg/dL (ref 0.2–1.2)
Total Protein: 6.7 g/dL (ref 6.0–8.3)

## 2019-04-13 LAB — LIPID PANEL
Cholesterol: 148 mg/dL (ref 0–200)
HDL: 37.7 mg/dL — ABNORMAL LOW (ref >39.00)
LDL Cholesterol: 85 mg/dL (ref 0–99)
NonHDL: 110.44
Total CHOL/HDL Ratio: 4
Triglycerides: 126 mg/dL (ref 0.0–149.0)
VLDL: 25.2 mg/dL (ref 0.0–40.0)

## 2019-04-13 NOTE — Telephone Encounter (Signed)
-----   Message from Ellamae Sia sent at 04/06/2019  3:19 PM EDT ----- Regarding: Lab orders for Thursdday, 7.2.20 Patient is scheduled for CPX labs, please order future labs, Thanks , Karna Christmas

## 2019-04-20 ENCOUNTER — Ambulatory Visit (INDEPENDENT_AMBULATORY_CARE_PROVIDER_SITE_OTHER): Payer: BC Managed Care – PPO | Admitting: Family Medicine

## 2019-04-20 ENCOUNTER — Encounter: Payer: Self-pay | Admitting: Family Medicine

## 2019-04-20 ENCOUNTER — Other Ambulatory Visit: Payer: Self-pay

## 2019-04-20 VITALS — BP 120/80 | HR 56 | Temp 98.4°F | Ht 75.5 in | Wt 188.5 lb

## 2019-04-20 DIAGNOSIS — M65311 Trigger thumb, right thumb: Secondary | ICD-10-CM | POA: Diagnosis not present

## 2019-04-20 DIAGNOSIS — Z0000 Encounter for general adult medical examination without abnormal findings: Secondary | ICD-10-CM | POA: Diagnosis not present

## 2019-04-20 NOTE — Progress Notes (Signed)
Chief Complaint  Patient presents with  . Annual Exam    History of Present Illness: HPI  The patient is here for annual wellness exam and preventative care.    Has been having trigger thumb in right thumb x 2 weeks .. not bothersome enough to do anything about it yet. He is right handed.Reviewed labs in detail.  He feels his wife likely had covid in 11/2018 after disney trip.Marland Kitchen. Dx with flu. He was mildly sick at that time.  Cholesterol improved from last year. Trigs improved as well now at goal. Lab Results  Component Value Date   CHOL 148 04/13/2019   HDL 37.70 (L) 04/13/2019   LDLCALC 85 04/13/2019   LDLDIRECT 95.0 11/20/2014   TRIG 126.0 04/13/2019   CHOLHDL 4 04/13/2019    Diet:  Improved.. not eating out  Exercise: Yard work, active job.  Wt Readings from Last 3 Encounters:  04/20/19 188 lb 8 oz (85.5 kg)  11/23/18 202 lb 6.4 oz (91.8 kg)  12/16/17 203 lb 8 oz (92.3 kg)  Body mass index is 23.25 kg/m.    Glucose .Marland Kitchen. was not fasting.   COVID 19 screen No recent travel or known exposure to COVID19 The patient denies respiratory symptoms of COVID 19 at this time.  The importance of social distancing was discussed today.   Review of Systems  Constitutional: Negative for chills and fever.  HENT: Negative for congestion and ear pain.   Eyes: Negative for pain and redness.  Respiratory: Negative for cough and shortness of breath.   Cardiovascular: Negative for chest pain, palpitations and leg swelling.  Gastrointestinal: Negative for abdominal pain, blood in stool, constipation, diarrhea, nausea and vomiting.  Genitourinary: Negative for dysuria.  Musculoskeletal: Negative for falls and myalgias.  Skin: Negative for rash.  Neurological: Negative for dizziness.  Psychiatric/Behavioral: Negative for depression. The patient is not nervous/anxious.       Past Medical History:  Diagnosis Date  . Pure hypercholesterolemia   . Pure hyperglyceridemia     reports that  he has never smoked. He has never used smokeless tobacco. He reports current alcohol use. He reports that he does not use drugs.  No current outpatient medications on file.   Observations/Objective: Blood pressure 120/80, pulse (!) 56, temperature 98.4 F (36.9 C), temperature source Temporal, height 6' 3.5" (1.918 m), weight 188 lb 8 oz (85.5 kg), SpO2 97 %.  Physical Exam Constitutional:      General: He is not in acute distress.    Appearance: Normal appearance. He is well-developed. He is not ill-appearing or toxic-appearing.  HENT:     Head: Normocephalic and atraumatic.     Right Ear: Hearing, tympanic membrane, ear canal and external ear normal.     Left Ear: Hearing, tympanic membrane, ear canal and external ear normal.     Nose: Nose normal.     Mouth/Throat:     Pharynx: Uvula midline.  Eyes:     General: Lids are normal. Lids are everted, no foreign bodies appreciated.     Conjunctiva/sclera: Conjunctivae normal.     Pupils: Pupils are equal, round, and reactive to light.  Neck:     Musculoskeletal: Normal range of motion and neck supple.     Thyroid: No thyroid mass or thyromegaly.     Vascular: No carotid bruit.     Trachea: Trachea and phonation normal.  Cardiovascular:     Rate and Rhythm: Normal rate and regular rhythm.     Pulses: Normal  pulses.     Heart sounds: S1 normal and S2 normal. No murmur. No gallop.   Pulmonary:     Breath sounds: Normal breath sounds. No wheezing, rhonchi or rales.  Abdominal:     General: Bowel sounds are normal.     Palpations: Abdomen is soft.     Tenderness: There is no abdominal tenderness. There is no guarding or rebound.     Hernia: No hernia is present.  Musculoskeletal:     Comments: Trigger thumb  Lymphadenopathy:     Cervical: No cervical adenopathy.  Skin:    General: Skin is warm and dry.     Findings: No rash.  Neurological:     Mental Status: He is alert.     Cranial Nerves: No cranial nerve deficit.      Sensory: No sensory deficit.     Gait: Gait normal.     Deep Tendon Reflexes: Reflexes are normal and symmetric.  Psychiatric:        Speech: Speech normal.        Behavior: Behavior normal.        Judgment: Judgment normal.      Assessment and Plan   The patient's preventative maintenance and recommended screening tests for an annual wellness exam were reviewed in full today. Brought up to date unless services declined.  Counselled on the importance of diet, exercise, and its role in overall health and mortality. The patient's FH and SH was reviewed, including their home life, tobacco status, and drug and alcohol status.   Vaccines: Uptodate. Prostate Cancer Screen:No family hx Colon Cancer Screen:No family hx, plan starting next year      Smoking Status:none ETOH/ drug SWN:IOEVO few weeks/none HIV screen:none      Eliezer Lofts, MD

## 2019-04-20 NOTE — Patient Instructions (Signed)
Trigger Finger  Trigger finger (stenosing tenosynovitis) is a condition that causes a finger to get stuck in a bent position. Each finger has a tough, cord-like tissue that connects muscle to bone (tendon), and each tendon is surrounded by a tunnel of tissue (tendon sheath). To move your finger, your tendon needs to slide freely through the sheath. Trigger finger happens when the tendon or the sheath thickens, making it difficult to move your finger. Trigger finger can affect any finger or a thumb. It may affect more than one finger. Mild cases may clear up with rest and medicine. Severe cases require more treatment. What are the causes? Trigger finger is caused by a thickened finger tendon or tendon sheath. The cause of this thickening is not known. What increases the risk? The following factors may make you more likely to develop this condition:  Doing activities that require a strong grip.  Having rheumatoid arthritis, gout, or diabetes.  Being 40-60 years old.  Being a woman. What are the signs or symptoms? Symptoms of this condition include:  Pain when bending or straightening your finger.  Tenderness or swelling where your finger attaches to the palm of your hand.  A lump in the palm of your hand or on the inside of your finger.  Hearing a popping sound when you try to straighten your finger.  Feeling a popping, catching, or locking sensation when you try to straighten your finger.  Being unable to straighten your finger. How is this diagnosed? This condition is diagnosed based on your symptoms and a physical exam. How is this treated? This condition may be treated by:  Resting your finger and avoiding activities that make symptoms worse.  Wearing a finger splint to keep your finger in a slightly bent position.  Taking NSAIDs to relieve pain and swelling.  Injecting medicine (steroids) into the tendon sheath to reduce swelling and irritation. Injections may need to be  repeated.  Having surgery to open the tendon sheath. This may be done if other treatments do not work and you cannot straighten your finger. You may need physical therapy after surgery. Follow these instructions at home:   Use moist heat to help reduce pain and swelling as told by your health care provider.  Rest your finger and avoid activities that make pain worse. Return to normal activities as told by your health care provider.  If you have a splint, wear it as told by your health care provider.  Take over-the-counter and prescription medicines only as told by your health care provider.  Keep all follow-up visits as told by your health care provider. This is important. Contact a health care provider if:  Your symptoms are not improving with home care. Summary  Trigger finger (stenosing tenosynovitis) causes your finger to get stuck in a bent position, and it can make it difficult and painful to straighten your finger.  This condition develops when a finger tendon or tendon sheath thickens.  Treatment starts with resting, wearing a splint, and taking NSAIDs.  In severe cases, surgery to open the tendon sheath may be needed. This information is not intended to replace advice given to you by your health care provider. Make sure you discuss any questions you have with your health care provider. Document Released: 07/18/2004 Document Revised: 09/10/2017 Document Reviewed: 09/08/2016 Elsevier Patient Education  2020 Elsevier Inc.  

## 2019-04-20 NOTE — Assessment & Plan Note (Signed)
NSAIDs, info provided. If not improcing will refer to hand specialist.

## 2019-05-08 ENCOUNTER — Telehealth: Payer: Self-pay | Admitting: Family Medicine

## 2019-05-08 DIAGNOSIS — Z20822 Contact with and (suspected) exposure to covid-19: Secondary | ICD-10-CM

## 2019-05-08 DIAGNOSIS — Z20828 Contact with and (suspected) exposure to other viral communicable diseases: Secondary | ICD-10-CM

## 2019-05-08 NOTE — Telephone Encounter (Signed)
Patient called.  Patient has a co-worker that tested positive for Covid 19 and another co-worker that's being tested for Covid 19.  Patient isn't having any symptoms for Covid 19.  Patient would like to be tested for Covid 19.

## 2019-05-08 NOTE — Telephone Encounter (Signed)
Patient's aware Dr.Bedsole is off until tomorrow.

## 2019-05-09 DIAGNOSIS — Z20828 Contact with and (suspected) exposure to other viral communicable diseases: Secondary | ICD-10-CM | POA: Diagnosis not present

## 2019-05-09 NOTE — Telephone Encounter (Signed)
Patient stated that he went today to [facility] and had the covid test done.

## 2019-05-09 NOTE — Telephone Encounter (Signed)
Ordered test.. please make sure pt aware of locations.. no appt needed.

## 2019-06-05 DIAGNOSIS — M65311 Trigger thumb, right thumb: Secondary | ICD-10-CM

## 2019-06-16 DIAGNOSIS — M65311 Trigger thumb, right thumb: Secondary | ICD-10-CM | POA: Diagnosis not present

## 2019-07-04 ENCOUNTER — Ambulatory Visit (INDEPENDENT_AMBULATORY_CARE_PROVIDER_SITE_OTHER): Payer: BC Managed Care – PPO

## 2019-07-04 DIAGNOSIS — Z23 Encounter for immunization: Secondary | ICD-10-CM | POA: Diagnosis not present

## 2019-12-22 ENCOUNTER — Ambulatory Visit: Payer: BC Managed Care – PPO | Attending: Internal Medicine

## 2019-12-22 DIAGNOSIS — Z23 Encounter for immunization: Secondary | ICD-10-CM

## 2019-12-22 NOTE — Progress Notes (Signed)
   Covid-19 Vaccination Clinic  Name:  Odus Clasby    MRN: [id number] DOB: 10/06/1971  12/22/2019  Mr. Barraco was observed post Covid-19 immunization for 15 minutes without incident. He was provided with Vaccine Information Sheet and instruction to access the V-Safe system.   Mr. Stuckey was instructed to call 911 with any severe reactions post vaccine: Marland Kitchen Difficulty breathing  . Swelling of face and throat  . A fast heartbeat  . A bad rash all over body  . Dizziness and weakness   Immunizations Administered    Name Date Dose VIS Date Route   Moderna COVID-19 Vaccine 12/22/2019  8:46 AM 0.5 mL 09/12/2019 Intramuscular   Manufacturer: Moderna   Lot: [id number]   NDC: 24268-341-96

## 2020-01-24 ENCOUNTER — Ambulatory Visit: Payer: BC Managed Care – PPO | Attending: Internal Medicine

## 2020-01-24 DIAGNOSIS — Z23 Encounter for immunization: Secondary | ICD-10-CM

## 2020-01-24 NOTE — Progress Notes (Signed)
   Covid-19 Vaccination Clinic  Name:  Makih Stefanko    MRN: [id number] DOB: Sep 16, 1971  01/24/2020  Mr. Knust was observed post Covid-19 immunization for 15 minutes without incident. He was provided with Vaccine Information Sheet and instruction to access the V-Safe system.   Mr. Macbride was instructed to call 911 with any severe reactions post vaccine: Marland Kitchen Difficulty breathing  . Swelling of face and throat  . A fast heartbeat  . A bad rash all over body  . Dizziness and weakness   Immunizations Administered    Name Date Dose VIS Date Route   Moderna COVID-19 Vaccine 01/24/2020  8:47 AM 0.5 mL 09/12/2019 Intramuscular   Manufacturer: Gala Murdoch   Lot: [id number]   NDC: 80777-273-99      Covid-19 Vaccination Clinic  Name:  Orman Matsumura    MRN: [id number] DOB: 1971/02/15  01/24/2020  Mr. Schicker was observed post Covid-19 immunization for 15 minutes without incident. He was provided with Vaccine Information Sheet and instruction to access the V-Safe system.   Mr. Sotomayor was instructed to call 911 with any severe reactions post vaccine: Marland Kitchen Difficulty breathing  . Swelling of face and throat  . A fast heartbeat  . A bad rash all over body  . Dizziness and weakness   Immunizations Administered    Name Date Dose VIS Date Route   Moderna COVID-19 Vaccine 01/24/2020  8:47 AM 0.5 mL 09/12/2019 Intramuscular   Manufacturer: Moderna   Lot: [id number]   NDC: 16606-301-60

## 2020-06-21 ENCOUNTER — Telehealth: Payer: Self-pay | Admitting: Family Medicine

## 2020-06-21 ENCOUNTER — Other Ambulatory Visit (INDEPENDENT_AMBULATORY_CARE_PROVIDER_SITE_OTHER): Payer: BC Managed Care – PPO

## 2020-06-21 ENCOUNTER — Other Ambulatory Visit: Payer: Self-pay

## 2020-06-21 DIAGNOSIS — Z13220 Encounter for screening for lipoid disorders: Secondary | ICD-10-CM

## 2020-06-21 DIAGNOSIS — Z125 Encounter for screening for malignant neoplasm of prostate: Secondary | ICD-10-CM | POA: Diagnosis not present

## 2020-06-21 LAB — PSA: PSA: 0.91 ng/mL (ref 0.10–4.00)

## 2020-06-21 LAB — LIPID PANEL
Cholesterol: 187 mg/dL (ref 0–200)
HDL: 42.5 mg/dL (ref >39.00)
LDL Cholesterol: 114 mg/dL — ABNORMAL HIGH (ref 0–99)
NonHDL: 144.73
Total CHOL/HDL Ratio: 4
Triglycerides: 154 mg/dL — ABNORMAL HIGH (ref 0.0–149.0)
VLDL: 30.8 mg/dL (ref 0.0–40.0)

## 2020-06-21 LAB — COMPREHENSIVE METABOLIC PANEL WITH GFR
ALT: 26 U/L (ref 0–53)
AST: 22 U/L (ref 0–37)
Albumin: 5 g/dL (ref 3.5–5.2)
Alkaline Phosphatase: 72 U/L (ref 39–117)
BUN: 17 mg/dL (ref 6–23)
CO2: 32 meq/L (ref 19–32)
Calcium: 9.7 mg/dL (ref 8.4–10.5)
Chloride: 103 meq/L (ref 96–112)
Creatinine, Ser: 1.16 mg/dL (ref 0.40–1.50)
GFR: 66.33 mL/min
Glucose, Bld: 98 mg/dL (ref 70–99)
Potassium: 4 meq/L (ref 3.5–5.1)
Sodium: 141 meq/L (ref 135–145)
Total Bilirubin: 0.6 mg/dL (ref 0.2–1.2)
Total Protein: 7.2 g/dL (ref 6.0–8.3)

## 2020-06-21 NOTE — Progress Notes (Signed)
No critical labs need to be addressed urgently. We will discuss labs in detail at upcoming office visit.   

## 2020-06-21 NOTE — Telephone Encounter (Signed)
-----   Message from Alvina Chou sent at 06/04/2020  2:09 PM EDT ----- Regarding: Lab orders for 9.10.21 Patient is scheduled for CPX labs, please order future labs, Thanks , Camelia Eng

## 2020-06-28 ENCOUNTER — Encounter: Payer: BC Managed Care – PPO | Admitting: Family Medicine

## 2020-07-02 ENCOUNTER — Encounter: Payer: BC Managed Care – PPO | Admitting: Family Medicine

## 2020-07-09 ENCOUNTER — Ambulatory Visit: Payer: BC Managed Care – PPO | Admitting: Family Medicine

## 2020-07-09 ENCOUNTER — Encounter: Payer: Self-pay | Admitting: Family Medicine

## 2020-07-09 ENCOUNTER — Other Ambulatory Visit: Payer: Self-pay

## 2020-07-09 VITALS — BP 100/60 | HR 56 | Temp 97.4°F | Ht 75.5 in | Wt 195.8 lb

## 2020-07-09 DIAGNOSIS — Z1211 Encounter for screening for malignant neoplasm of colon: Secondary | ICD-10-CM | POA: Diagnosis not present

## 2020-07-09 DIAGNOSIS — Z0000 Encounter for general adult medical examination without abnormal findings: Secondary | ICD-10-CM | POA: Diagnosis not present

## 2020-07-09 DIAGNOSIS — Z23 Encounter for immunization: Secondary | ICD-10-CM

## 2020-07-09 NOTE — Progress Notes (Signed)
Chief Complaint  Patient presents with  . Annual Exam    History of Present Illness: HPI  The patient is here for annual wellness exam and preventative care.    Doing well overall.  Depression screen [facility] 2/9 07/09/2020 04/20/2019 12/16/2017  Decreased Interest 0 0 0  Down, Depressed, Hopeless 0 0 0  PHQ - 2 Score 0 0 0   Reviewed labs in detail   The 10-year ASCVD risk score Denman George DC Jr., et al., 2013) is: 2.7%   Values used to calculate the score:     Age: 8 years     Sex: Male     Is Non-Hispanic African American: No     Diabetic: No     Tobacco smoker: No     Systolic Blood Pressure: 100 mmHg     Is BP treated: No     HDL Cholesterol: 42.5 mg/dL     Total Cholesterol: 187 mg/dL  Lab Results  Component Value Date   CHOL 187 06/21/2020   HDL 42.50 06/21/2020   LDLCALC 114 (H) 06/21/2020   LDLDIRECT 95.0 11/20/2014   TRIG 154.0 (H) 06/21/2020   CHOLHDL 4 06/21/2020    Diet: good Exercise: minimal    This visit occurred during the SARS-CoV-2 public health emergency.  Safety protocols were in place, including screening questions prior to the visit, additional usage of staff PPE, and extensive cleaning of exam room while observing appropriate contact time as indicated for disinfecting solutions.   COVID 19 screen:  No recent travel or known exposure to COVID19 The patient denies respiratory symptoms of COVID 19 at this time. The importance of social distancing was discussed today.     Review of Systems  Constitutional: Negative for chills and fever.  HENT: Negative for congestion and ear pain.   Eyes: Negative for pain and redness.  Respiratory: Negative for cough and shortness of breath.   Cardiovascular: Negative for chest pain, palpitations and leg swelling.  Gastrointestinal: Negative for abdominal pain, blood in stool, constipation, diarrhea, nausea and vomiting.  Genitourinary: Negative for dysuria.  Musculoskeletal: Negative for falls and myalgias.   Skin: Negative for rash.  Neurological: Negative for dizziness.  Psychiatric/Behavioral: Negative for depression. The patient is not nervous/anxious.       Past Medical History:  Diagnosis Date  . Pure hypercholesterolemia   . Pure hyperglyceridemia     reports that he has never smoked. He has never used smokeless tobacco. He reports current alcohol use. He reports that he does not use drugs.  No current outpatient medications on file.   Observations/Objective: Blood pressure 100/60, pulse (!) 56, temperature (!) 97.4 F (36.3 C), temperature source Temporal, height 6' 3.5" (1.918 m), weight 195 lb 12 oz (88.8 kg), SpO2 97 %. Body mass index is 24.14 kg/m.  Physical Exam Constitutional:      General: He is not in acute distress.    Appearance: Normal appearance. He is well-developed. He is not ill-appearing or toxic-appearing.  HENT:     Head: Normocephalic and atraumatic.     Right Ear: Hearing, tympanic membrane, ear canal and external ear normal.     Left Ear: Hearing, tympanic membrane, ear canal and external ear normal.     Nose: Nose normal.     Mouth/Throat:     Pharynx: Uvula midline.  Eyes:     General: Lids are normal. Lids are everted, no foreign bodies appreciated.     Conjunctiva/sclera: Conjunctivae normal.     Pupils: Pupils  are equal, round, and reactive to light.  Neck:     Thyroid: No thyroid mass or thyromegaly.     Vascular: No carotid bruit.     Trachea: Trachea and phonation normal.  Cardiovascular:     Rate and Rhythm: Normal rate and regular rhythm.     Pulses: Normal pulses.     Heart sounds: S1 normal and S2 normal. No murmur heard.  No gallop.   Pulmonary:     Breath sounds: Normal breath sounds. No wheezing, rhonchi or rales.  Abdominal:     General: Bowel sounds are normal.     Palpations: Abdomen is soft.     Tenderness: There is no abdominal tenderness. There is no guarding or rebound.     Hernia: No hernia is present.   Musculoskeletal:     Cervical back: Normal range of motion and neck supple.  Lymphadenopathy:     Cervical: No cervical adenopathy.  Skin:    General: Skin is warm and dry.     Findings: No rash.  Neurological:     Mental Status: He is alert.     Cranial Nerves: No cranial nerve deficit.     Sensory: No sensory deficit.     Gait: Gait normal.     Deep Tendon Reflexes: Reflexes are normal and symmetric.  Psychiatric:        Speech: Speech normal.        Behavior: Behavior normal.        Judgment: Judgment normal.      Assessment and Plan The patient's preventative maintenance and recommended screening tests for an annual wellness exam were reviewed in full today. Brought up to date unless services declined.  Counselled on the importance of diet, exercise, and its role in overall health and mortality. The patient's FH and SH was reviewed, including their home life, tobacco status, and drug and alcohol status.   Vaccines:Uptodate with COVID19,  Recommended flu vaccine and shingles vaccine today. Prostate Cancer Screen: Lab Results  Component Value Date   PSA 0.91 06/21/2020  Colon Cancer Screen:due now.      Smoking Status:none ETOH/ drug MCN:OBSJG few weeks/none HIV screen:none Hep C: due.. plan next year.    Kerby Nora, MD

## 2020-07-09 NOTE — Addendum Note (Signed)
Addended by: Damita Lack on: 07/09/2020 11:30 AM   Modules accepted: Orders

## 2020-07-09 NOTE — Patient Instructions (Addendum)
Get second shingles vaccine in 2-6 months.   GI office will call with colonoscopy appt!  Preventive Care 29-51 Years Old, Male Preventive care refers to lifestyle choices and visits with your health care provider that can promote health and wellness. This includes:  A yearly physical exam. This is also called an annual well check.  Regular dental and eye exams.  Immunizations.  Screening for certain conditions.  Healthy lifestyle choices, such as eating a healthy diet, getting regular exercise, not using drugs or products that contain nicotine and tobacco, and limiting alcohol use. What can I expect for my preventive care visit? Physical exam Your health care provider will check:  Height and weight. These may be used to calculate body mass index (BMI), which is a measurement that tells if you are at a healthy weight.  Heart rate and blood pressure.  Your skin for abnormal spots. Counseling Your health care provider may ask you questions about:  Alcohol, tobacco, and drug use.  Emotional well-being.  Home and relationship well-being.  Sexual activity.  Eating habits.  Work and work Statistician. What immunizations do I need?  Influenza (flu) vaccine  This is recommended every year. Tetanus, diphtheria, and pertussis (Tdap) vaccine  You may need a Td booster every 10 years. Varicella (chickenpox) vaccine  You may need this vaccine if you have not already been vaccinated. Zoster (shingles) vaccine  You may need this after age 1. Measles, mumps, and rubella (MMR) vaccine  You may need at least one dose of MMR if you were born in 1957 or later. You may also need a second dose. Pneumococcal conjugate (PCV13) vaccine  You may need this if you have certain conditions and were not previously vaccinated. Pneumococcal polysaccharide (PPSV23) vaccine  You may need one or two doses if you smoke cigarettes or if you have certain conditions. Meningococcal conjugate  (MenACWY) vaccine  You may need this if you have certain conditions. Hepatitis A vaccine  You may need this if you have certain conditions or if you travel or work in places where you may be exposed to hepatitis A. Hepatitis B vaccine  You may need this if you have certain conditions or if you travel or work in places where you may be exposed to hepatitis B. Haemophilus influenzae type b (Hib) vaccine  You may need this if you have certain risk factors. Human papillomavirus (HPV) vaccine  If recommended by your health care provider, you may need three doses over 6 months. You may receive vaccines as individual doses or as more than one vaccine together in one shot (combination vaccines). Talk with your health care provider about the risks and benefits of combination vaccines. What tests do I need? Blood tests  Lipid and cholesterol levels. These may be checked every 5 years, or more frequently if you are over 30 years old.  Hepatitis C test.  Hepatitis B test. Screening  Lung cancer screening. You may have this screening every year starting at age 39 if you have a 30-pack-year history of smoking and currently smoke or have quit within the past 15 years.  Prostate cancer screening. Recommendations will vary depending on your family history and other risks.  Colorectal cancer screening. All adults should have this screening starting at age 37 and continuing until age 12. Your health care provider may recommend screening at age 23 if you are at increased risk. You will have tests every 1-10 years, depending on your results and the type of screening test.  Diabetes screening. This is done by checking your blood sugar (glucose) after you have not eaten for a while (fasting). You may have this done every 1-3 years.  Sexually transmitted disease (STD) testing. Follow these instructions at home: Eating and drinking  Eat a diet that includes fresh fruits and vegetables, whole grains,  lean protein, and low-fat dairy products.  Take vitamin and mineral supplements as recommended by your health care provider.  Do not drink alcohol if your health care provider tells you not to drink.  If you drink alcohol: ? Limit how much you have to 0-2 drinks a day. ? Be aware of how much alcohol is in your drink. In the [place] one drink equals one 12 oz bottle of beer (355 mL), one 5 oz glass of wine (148 mL), or one 1 oz glass of hard liquor (44 mL). Lifestyle  Take daily care of your teeth and gums.  Stay active. Exercise for at least 30 minutes on 5 or more days each week.  Do not use any products that contain nicotine or tobacco, such as cigarettes, e-cigarettes, and chewing tobacco. If you need help quitting, ask your health care provider.  If you are sexually active, practice safe sex. Use a condom or other form of protection to prevent STIs (sexually transmitted infections).  Talk with your health care provider about taking a low-dose aspirin every day starting at age 103. What's next?  Go to your health care provider once a year for a well check visit.  Ask your health care provider how often you should have your eyes and teeth checked.  Stay up to date on all vaccines. This information is not intended to replace advice given to you by your health care provider. Make sure you discuss any questions you have with your health care provider. Document Revised: 09/22/2018 Document Reviewed: 09/22/2018 Elsevier Patient Education  2020 [employer].

## 2020-08-19 ENCOUNTER — Telehealth: Payer: Self-pay | Admitting: Family Medicine

## 2020-08-19 NOTE — Telephone Encounter (Signed)
Pt called in wanted to know about getting a referral for a colonoscopy, I saw that one was put in but it was sent to NP [facility]ender but he wanted the referral to go to Dr. Rolene Arbour at [facility]

## 2020-08-20 NOTE — Telephone Encounter (Signed)
See patient note. Do you need a new referral to fix?

## 2020-09-04 ENCOUNTER — Telehealth: Payer: Self-pay | Admitting: Family Medicine

## 2020-09-04 NOTE — Telephone Encounter (Signed)
Yes, in September referral went to a wrong provider. I have sent over referral and confirmed that it went to the right fax number. Called patient and left detailed message apologizing for the mixed up and that referral was sent over to the right provider now.

## 2020-09-04 NOTE — Telephone Encounter (Signed)
Pt called in wanted to know about the referral for the gastroenterology , the name of the doctor is Dr. Rolene Arbour at [facility] ,  Address: [address] [place] [place], [place], [place] [address] phone number [phone] and the fax number [phone]

## 2020-09-04 NOTE — Telephone Encounter (Signed)
Ray Park,  Patient has been trying to get this referral for his colonoscopy but I think it got sent to wrong provider.  Can you please work on this for me?

## 2020-11-07 ENCOUNTER — Ambulatory Visit (INDEPENDENT_AMBULATORY_CARE_PROVIDER_SITE_OTHER): Payer: BC Managed Care – PPO

## 2020-11-07 DIAGNOSIS — Z23 Encounter for immunization: Secondary | ICD-10-CM

## 2020-11-22 DIAGNOSIS — Z20822 Contact with and (suspected) exposure to covid-19: Secondary | ICD-10-CM | POA: Diagnosis not present

## 2021-03-06 ENCOUNTER — Ambulatory Visit: Payer: BC Managed Care – PPO | Attending: Internal Medicine

## 2021-03-06 ENCOUNTER — Other Ambulatory Visit (HOSPITAL_BASED_OUTPATIENT_CLINIC_OR_DEPARTMENT_OTHER): Payer: Self-pay

## 2021-03-06 ENCOUNTER — Other Ambulatory Visit: Payer: Self-pay

## 2021-03-06 DIAGNOSIS — Z23 Encounter for immunization: Secondary | ICD-10-CM

## 2021-03-06 MED ORDER — COVID-19 MRNA VACC (MODERNA) 100 MCG/0.5ML IM SUSP
INTRAMUSCULAR | 0 refills | Status: DC
  Filled 2021-03-06: qty 0.25, 1d supply, fill #0

## 2021-03-06 NOTE — Progress Notes (Signed)
   Covid-19 Vaccination Clinic  Name:  Melchizedek Espinola    MRN: [id number] DOB: Oct 22, 1970  03/06/2021  Mr. Kurdziel was observed post Covid-19 immunization for 15 minutes without incident. He was provided with Vaccine Information Sheet and instruction to access the V-Safe system.   Mr. Brar was instructed to call 911 with any severe reactions post vaccine: Marland Kitchen Difficulty breathing  . Swelling of face and throat  . A fast heartbeat  . A bad rash all over body  . Dizziness and weakness   Immunizations Administered    Name Date Dose VIS Date Route   Moderna Covid-19 Booster Vaccine 03/06/2021 10:12 AM 0.25 mL 07/31/2020 Intramuscular   Manufacturer: Moderna   Lot: [id number]   NDC: 14103-013-14

## 2021-05-19 DIAGNOSIS — Z1211 Encounter for screening for malignant neoplasm of colon: Secondary | ICD-10-CM | POA: Diagnosis not present

## 2021-05-19 DIAGNOSIS — K621 Rectal polyp: Secondary | ICD-10-CM | POA: Diagnosis not present

## 2021-05-19 DIAGNOSIS — D120 Benign neoplasm of cecum: Secondary | ICD-10-CM | POA: Diagnosis not present

## 2021-05-19 DIAGNOSIS — D123 Benign neoplasm of transverse colon: Secondary | ICD-10-CM | POA: Diagnosis not present

## 2021-05-19 LAB — HM COLONOSCOPY

## 2021-05-21 ENCOUNTER — Encounter: Payer: Self-pay | Admitting: Family Medicine

## 2021-06-20 ENCOUNTER — Telehealth: Payer: Self-pay | Admitting: Family Medicine

## 2021-06-20 DIAGNOSIS — Z125 Encounter for screening for malignant neoplasm of prostate: Secondary | ICD-10-CM

## 2021-06-20 DIAGNOSIS — Z13220 Encounter for screening for lipoid disorders: Secondary | ICD-10-CM

## 2021-06-20 NOTE — Telephone Encounter (Signed)
-----   Message from Alvina Chou sent at 06/17/2021 11:36 AM EDT ----- Regarding: Lab orders for Thursday, 9.22.22 Patient is scheduled for CPX labs, please order future labs, Thanks , Camelia Eng

## 2021-07-03 ENCOUNTER — Other Ambulatory Visit (INDEPENDENT_AMBULATORY_CARE_PROVIDER_SITE_OTHER): Payer: BC Managed Care – PPO

## 2021-07-03 ENCOUNTER — Other Ambulatory Visit: Payer: Self-pay

## 2021-07-03 DIAGNOSIS — Z13220 Encounter for screening for lipoid disorders: Secondary | ICD-10-CM | POA: Diagnosis not present

## 2021-07-03 DIAGNOSIS — Z125 Encounter for screening for malignant neoplasm of prostate: Secondary | ICD-10-CM

## 2021-07-03 LAB — LIPID PANEL
Cholesterol: 182 mg/dL (ref 0–200)
HDL: 36.8 mg/dL — ABNORMAL LOW (ref >39.00)
LDL Cholesterol: 110 mg/dL — ABNORMAL HIGH (ref 0–99)
NonHDL: 145.64
Total CHOL/HDL Ratio: 5
Triglycerides: 180 mg/dL — ABNORMAL HIGH (ref 0.0–149.0)
VLDL: 36 mg/dL (ref 0.0–40.0)

## 2021-07-03 LAB — COMPREHENSIVE METABOLIC PANEL
ALT: 23 U/L (ref 0–53)
AST: 23 U/L (ref 0–37)
Albumin: 4.7 g/dL (ref 3.5–5.2)
Alkaline Phosphatase: 65 U/L (ref 39–117)
BUN: 17 mg/dL (ref 6–23)
CO2: 32 mEq/L (ref 19–32)
Calcium: 9.8 mg/dL (ref 8.4–10.5)
Chloride: 102 mEq/L (ref 96–112)
Creatinine, Ser: 1.18 mg/dL (ref 0.40–1.50)
GFR: 71.1 mL/min (ref >60.00)
Glucose, Bld: 89 mg/dL (ref 70–99)
Potassium: 4 mEq/L (ref 3.5–5.1)
Sodium: 141 mEq/L (ref 135–145)
Total Bilirubin: 0.6 mg/dL (ref 0.2–1.2)
Total Protein: 7.2 g/dL (ref 6.0–8.3)

## 2021-07-03 LAB — PSA: PSA: 0.86 ng/mL (ref 0.10–4.00)

## 2021-07-03 NOTE — Progress Notes (Signed)
No critical labs need to be addressed urgently. We will discuss labs in detail at upcoming office visit.   

## 2021-07-10 ENCOUNTER — Ambulatory Visit: Payer: BC Managed Care – PPO | Admitting: Family Medicine

## 2021-07-10 ENCOUNTER — Other Ambulatory Visit: Payer: Self-pay

## 2021-07-10 VITALS — BP 112/64 | HR 58 | Temp 98.0°F | Ht 75.25 in | Wt 195.4 lb

## 2021-07-10 DIAGNOSIS — Z0000 Encounter for general adult medical examination without abnormal findings: Secondary | ICD-10-CM

## 2021-07-10 NOTE — Progress Notes (Signed)
Patient ID: Ray Park, male    DOB: [birth year], 50 y.o.   MRN: [id number]  This visit was conducted in person.  BP 112/64   Pulse (!) 58   Temp 98 F (36.7 C) (Temporal)   Ht 6' 3.25" (1.911 m)   Wt 195 lb 7 oz (88.6 kg)   SpO2 98%   BMI 24.27 kg/m    CC:  Chief Complaint  Patient presents with   Annual Exam    No concerns     Subjective:   HPI: Ray Park is a 50 y.o. male presenting on 07/10/2021 for Annual Exam (No concerns )    Reviewed labs in detail with patient. Lab Results  Component Value Date   CHOL 182 07/03/2021   HDL 36.80 (L) 07/03/2021   LDLCALC 110 (H) 07/03/2021   LDLDIRECT 95.0 11/20/2014   TRIG 180.0 (H) 07/03/2021   CHOLHDL 5 07/03/2021   The 10-year ASCVD risk score (Arnett DK, et al., 2019) is: 4%   Values used to calculate the score:     Age: 28 years     Sex: Male     Is Non-Hispanic African American: No     Diabetic: No     Tobacco smoker: No     Systolic Blood Pressure: 112 mmHg     Is BP treated: No     HDL Cholesterol: 36.8 mg/dL     Total Cholesterol: 182 mg/dL   Diet:  moderate Exercise: minimal but active job. Wt Readings from Last 3 Encounters:  07/10/21 195 lb 7 oz (88.6 kg)  07/09/20 195 lb 12 oz (88.8 kg)  04/20/19 188 lb 8 oz (85.5 kg)  Body mass index is 24.27 kg/m.        Relevant past medical, surgical, family and social history reviewed and updated as indicated. Interim medical history since our last visit reviewed. Allergies and medications reviewed and updated. Outpatient Medications Prior to Visit  Medication Sig Dispense Refill   COVID-19 mRNA vaccine, Moderna, 100 MCG/0.5ML injection Inject into the muscle. 0.25 mL 0   No facility-administered medications prior to visit.     Per HPI unless specifically indicated in ROS section below Review of Systems  Constitutional:  Negative for fatigue and fever.  HENT:  Negative for ear pain.   Eyes:  Negative for pain.  Respiratory:  Negative for  cough and shortness of breath.   Cardiovascular:  Negative for chest pain, palpitations and leg swelling.  Gastrointestinal:  Negative for abdominal pain.  Genitourinary:  Negative for dysuria.  Musculoskeletal:  Negative for arthralgias.  Neurological:  Negative for syncope, light-headedness and headaches.  Psychiatric/Behavioral:  Negative for dysphoric mood.   Objective:  BP 112/64   Pulse (!) 58   Temp 98 F (36.7 C) (Temporal)   Ht 6' 3.25" (1.911 m)   Wt 195 lb 7 oz (88.6 kg)   SpO2 98%   BMI 24.27 kg/m   Wt Readings from Last 3 Encounters:  07/10/21 195 lb 7 oz (88.6 kg)  07/09/20 195 lb 12 oz (88.8 kg)  04/20/19 188 lb 8 oz (85.5 kg)      Physical Exam Constitutional:      General: He is not in acute distress.    Appearance: Normal appearance. He is well-developed. He is not ill-appearing or toxic-appearing.  HENT:     Head: Normocephalic and atraumatic.     Right Ear: Hearing, tympanic membrane, ear canal and external ear normal.     Left  Ear: Hearing, tympanic membrane, ear canal and external ear normal.     Nose: Nose normal.     Mouth/Throat:     Pharynx: Uvula midline.  Eyes:     General: Lids are normal. Lids are everted, no foreign bodies appreciated.     Conjunctiva/sclera: Conjunctivae normal.     Pupils: Pupils are equal, round, and reactive to light.  Neck:     Thyroid: No thyroid mass or thyromegaly.     Vascular: No carotid bruit.     Trachea: Trachea and phonation normal.  Cardiovascular:     Rate and Rhythm: Normal rate and regular rhythm.     Pulses: Normal pulses.     Heart sounds: S1 normal and S2 normal. No murmur heard.   No gallop.  Pulmonary:     Breath sounds: Normal breath sounds. No wheezing, rhonchi or rales.  Abdominal:     General: Bowel sounds are normal.     Palpations: Abdomen is soft.     Tenderness: There is no abdominal tenderness. There is no guarding or rebound.     Hernia: No hernia is present.  Musculoskeletal:      Cervical back: Normal range of motion and neck supple.  Lymphadenopathy:     Cervical: No cervical adenopathy.  Skin:    General: Skin is warm and dry.     Findings: No rash.  Neurological:     Mental Status: He is alert.     Cranial Nerves: No cranial nerve deficit.     Sensory: No sensory deficit.     Gait: Gait normal.     Deep Tendon Reflexes: Reflexes are normal and symmetric.  Psychiatric:        Speech: Speech normal.        Behavior: Behavior normal.        Judgment: Judgment normal.      Results for orders placed or performed in visit on 07/03/21  PSA  Result Value Ref Range   PSA 0.86 0.10 - 4.00 ng/mL  Comprehensive metabolic panel  Result Value Ref Range   Sodium 141 135 - 145 mEq/L   Potassium 4.0 3.5 - 5.1 mEq/L   Chloride 102 96 - 112 mEq/L   CO2 32 19 - 32 mEq/L   Glucose, Bld 89 70 - 99 mg/dL   BUN 17 6 - 23 mg/dL   Creatinine, Ser 9.62 0.40 - 1.50 mg/dL   Total Bilirubin 0.6 0.2 - 1.2 mg/dL   Alkaline Phosphatase 65 39 - 117 U/L   AST 23 0 - 37 U/L   ALT 23 0 - 53 U/L   Total Protein 7.2 6.0 - 8.3 g/dL   Albumin 4.7 3.5 - 5.2 g/dL   GFR 22.97 >98.92 mL/min   Calcium 9.8 8.4 - 10.5 mg/dL  Lipid panel  Result Value Ref Range   Cholesterol 182 0 - 200 mg/dL   Triglycerides 119.4 (H) 0.0 - 149.0 mg/dL   HDL 17.40 (L) >81.44 mg/dL   VLDL 81.8 0.0 - 56.3 mg/dL   LDL Cholesterol 149 (H) 0 - 99 mg/dL   Total CHOL/HDL Ratio 5    NonHDL 145.64     This visit occurred during the SARS-CoV-2 public health emergency.  Safety protocols were in place, including screening questions prior to the visit, additional usage of staff PPE, and extensive cleaning of exam room while observing appropriate contact time as indicated for disinfecting solutions.   COVID 19 screen:  No recent travel or known exposure  to COVID19 The patient denies respiratory symptoms of COVID 19 at this time. The importance of social distancing was discussed today.   Assessment and Plan The  patient's preventative maintenance and recommended screening tests for an annual wellness exam were reviewed in full today. Brought up to date unless services declined.  Counselled on the importance of diet, exercise, and its role in overall health and mortality. The patient's FH and SH was reviewed, including their home life, tobacco status, and drug and alcohol status.     Vaccines: Uptodate with COVID19 x 4 ( scheduled for bivalent tommorow),  Td , flu  and shingrix uptodate  Prostate Cancer Screen:  Lab Results  Component Value Date   PSA 0.86 07/03/2021   PSA 0.91 06/21/2020   Colon Cancer Screen:  05/19/21 plan repeat in 5 years      Smoking Status: none ETOH/ drug use:  Every few weeks/none  HIV screen:  none Hep C: due.   Kerby Nora, MD

## 2021-07-11 ENCOUNTER — Ambulatory Visit: Payer: BC Managed Care – PPO | Attending: Internal Medicine

## 2021-07-11 DIAGNOSIS — Z23 Encounter for immunization: Secondary | ICD-10-CM

## 2021-07-11 NOTE — Progress Notes (Signed)
   Covid-19 Vaccination Clinic  Name:  Ray Park    MRN: [id number] DOB: [birth year]  07/11/2021  [name] was observed post Covid-19 immunization for 15 minutes without incident. He was provided with Vaccine Information Sheet and instruction to access the V-Safe system.   [name] was instructed to call 911 with any severe reactions post vaccine: Difficulty breathing  Swelling of face and throat  A fast heartbeat  A bad rash all over body  Dizziness and weakness

## 2021-07-22 ENCOUNTER — Other Ambulatory Visit (HOSPITAL_BASED_OUTPATIENT_CLINIC_OR_DEPARTMENT_OTHER): Payer: Self-pay

## 2021-07-22 MED ORDER — MODERNA COVID-19 BIVAL BOOSTER 50 MCG/0.5ML IM SUSP
INTRAMUSCULAR | 0 refills | Status: DC
  Filled 2021-07-22: qty 0.5, 1d supply, fill #0

## 2021-08-26 ENCOUNTER — Other Ambulatory Visit: Payer: Self-pay | Admitting: Family Medicine

## 2021-08-26 DIAGNOSIS — R41840 Attention and concentration deficit: Secondary | ICD-10-CM

## 2021-08-26 NOTE — Progress Notes (Signed)
This pt needs an adult ADD eval... Dr. Reggy Eye I belive.I have place the referral.   Does he need to call or will they call him to schedule?

## 2021-09-02 ENCOUNTER — Encounter: Payer: Self-pay | Admitting: *Deleted

## 2021-09-02 NOTE — Progress Notes (Signed)
MyChart message with referral information sent to [facility].

## 2021-09-02 NOTE — Progress Notes (Signed)
Her referral was sent to [facility] They will review and usually call to schedule  - we cannot schedule those appts d/t the nature of the referral.  The patient is also able to call. The referral is in the system so they can schedule her when she calls.   Their number is Phone: [phone] -- direct line to scheduling

## 2021-09-02 NOTE — Progress Notes (Signed)
Ray Park, can you call  or mychart and make sure pt is aware of the info Ashtyn has provided? Thanks

## 2021-10-29 ENCOUNTER — Encounter: Payer: Self-pay | Admitting: Psychology

## 2021-10-29 ENCOUNTER — Ambulatory Visit: Payer: BC Managed Care – PPO | Admitting: Psychology

## 2021-10-29 DIAGNOSIS — F4010 Social phobia, unspecified: Secondary | ICD-10-CM

## 2021-10-29 NOTE — Progress Notes (Signed)
[place] Counselor Initial Adult Exam  Name: Ray Park Date: 10/29/2021 MRN: [id number] DOB: Dec 07, 1970 PCP: Jinny Sanders, MD  Time spent: 3:00 - 4:00pm  Guardian/Payee:  Self    Paperwork requested: No   Met with patient and spouse for initial interview.  Patient and spouse were at home due to COVID-19 restrictions and session was conducted from therapist's office via video conferencing.  Patient and spouse verbally consented to telehealth.      Reason for Visit /Presenting Problem: Patient here at the request of his wife. Patient and spouse have communication issues and wife suspected attention as being part of that difficulty.  Patient able to write well but he struggles interpersonally, especially with listening. Patient indicated that his thoughts get jumbled while listening so his conclusions are different from what wife intends.  Patient more comfortable writing than speaking.  Has difficulty with eye contact.  Has trouble following multi-step directions, even when patient asked to repeat the instructions.  Tends to ramble when speaking.  These issues have not been raised to him during childhood or during school.  Has difficulty with punctuality.  Has difficulty with word retrieval at times.  Wife reports lack of focus, having difficulty recalling what wife says to him.  His mind tends to  think ahead which keeps him from listening attentively. Loses focus on less desirable activities.  In school was better with book work than lab work or applied learning/mechanics.  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 Has good memory for remote dates of interest.    Mental Status Exam: Appearance:   Neat and Well Groomed     Behavior:  Appropriate  Motor:  Normal  Speech/Language:   Clear and Coherent  Affect:  Appropriate  Mood:  euthymic  Thought process:  normal  Thought content:    WNL  Sensory/Perceptual disturbances:    WNL  Orientation:  oriented to person, place,  time/date, and situation  Attention:  Good  Concentration:  Fair  Memory:  Remote;   [place] of knowledge:   Good  Insight:    Good  Judgment:   Good  Impulse Control:  Good    Reported Symptoms:  Falls asleep easily and is sound sleeper.  No changes in appetite.  Energy is strong through most of the day.  No prolonged sadness/depression.  No panic attacks or specific worries/fears.  Some general worry and social anxiety (drive thru's and phone calls).  No mania.  No obsessive thought.  Some/rare compulsive behavior (excessive checking).Trouble paying attention since school days (would daydream often).  Easily distracted (has several majors and attended 3 universities during college).  Frequent forgetting instructions and activities (per wife).  Losing things but not the same thing repeatedly.  Good organization at work. Not hyper or impulsive.  Has some close friendships but more introverted in general.  Adequate with social cues, no repetitive speech or behavior, no overly intense interests.  Struggles with changing habits and routines.  No sensory hypersensitivity.         Risk Assessment: Danger to Self:  No Self-injurious Behavior: No Danger to Others: No Duty to Warn:no Physical Aggression / Violence:No  Access to Firearms a concern: No  Gang Involvement:No  Patient / guardian was educated about steps to take if suicide or homicide risk level increases between visits: n/a While future psychiatric events cannot be accurately predicted, the patient does not currently require acute inpatient psychiatric care and does not currently meet [facility] involuntary  commitment criteria.  Substance Abuse History: Current substance abuse: No   Rare alcohol use 1x per month. No smoking  Past Psychiatric History:   Previous psychological history is significant for attention/focus Outpatient Providers:None History of Psych Hospitalization: No  Psychological Testing: Attention/ADHD:  online  test only.    Abuse History:  Victim of: No.,  None    Report needed: No. Victim of Neglect:No. Perpetrator of  None   Witness / Exposure to Domestic Violence: No   Protective Services Involvement: No  Witness to [employer] Violence:  No   Family History:  Family History  Problem Relation Age of Onset   Hypertension Father    Diabetes Father    Alzheimer's disease Mother        Early onset (31's)   Alzheimer's disease Maternal Grandfather    Alzheimer's disease Maternal Aunt    Cancer Sister 68       uterine cancer   Cancer Maternal Grandmother 60       uterine    Living situation: the patient lives with their spouse  Sexual Orientation: Straight  Relationship Status: married - 1st marriage - married for 6 years known each other for 10 years.  Relations are strained related to problems with communication.  Wife feels that patient does not listen to him.  Otherwise they have a good relationship.   Name of spouse / other:Natalie If a parent, number of children / ages:None  Support Systems: spouse, mother in law  Close friends known for decades, Hasn't seen friends much since the pandemic.   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  Started with prior company in 1995 before it was bought out by [facility] [employer] supply. In charge of inventory and purchasing.  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  Struggled with customer relations so was taken off that area.  Work is easier for him now that he is just doing inventory.  Struggles with multitasking (couldn't switch from a project to customer service.  Had trouble regulating his time frequently working overtime.  Stays organized at work but often gets to work and leaves late.  Would be up to one hour late if wife doesn't prompt him.      Military Service: No   Educational History: Education: Forensic psychologist  - degree in psychology from [employer] but attended 3 different school with several chosen majors.  Performed well in academically until 9th grade (family moved age 39). Grades were B-C during high school.      Recreation/Hobbies: writing/poetry, music - Geologist, engineering, checkers, photography.  Good these but not great.  [employer] aquariums, travel, disney.     Stressors: Marital or family conflict    Strengths: Easy to get along with but lacks assertiveness at times.  Well rounded - has wide variety of skills and experiences. Kind empathetic  Legal History: Pending legal issue / charges: The patient has no significant history of legal issues. History of legal issue / charges:  None  Medical History/Surgical History: reviewed Past Medical History:  Diagnosis Date   Pure hypercholesterolemia    Pure hyperglyceridemia     Past Surgical History:  Procedure Laterality Date   ACL replacement  2001   US ECHOCARDIOGRAPHY     No MVP; benign murmur    Medications: Current Outpatient Medications  Medication Sig Dispense Refill   COVID-19 mRNA bivalent vaccine, Moderna, (MODERNA COVID-19 BIVAL BOOSTER)  50 MCG/0.5ML injection Inject into the muscle. 0.5 mL 0   No current facility-administered medications for this visit.   No seizures, concussion or HI No Known Allergies  Developmental History No early delays but required speech therapy during 5th grade for articulation, despite doing well academically. Gross motor skills - poor mechanical skills,  motor planning but adequate with sports Fine Motor - trouble tying a knot, closing bags, using scissors, folding clothes,  Speech - not as comfortable with verbal communication still mumbles at times and has trouble with word retrieval.   Self care - good. Independent skills - good but wife keeps him on task (wife very on top of things).      Social - introverted but has a few close friends. Diagnoses:  Social anxiety disorder  Plan of Care:  Patient presents with suspected attention and organizational deficits, currently affecting martial relations and previously affecting school and work Systems analyst.  Patient history is significant for fine motor and speech impairment along with social anxiety.  Testing is recommended to evaluate for ADHD as well as other conditions that may be affecting attention.        Test Battery - In person K-BIT-2, BRIEF-A, CNSVS, Adult ADHD, DASS, Adult OCD Inventory, Liebowitz Social Anxiety Scale [name], PhD

## 2021-10-29 NOTE — Progress Notes (Signed)
                Clarnce Homan, PhD 

## 2021-11-10 ENCOUNTER — Encounter: Payer: Self-pay | Admitting: Psychology

## 2021-11-10 ENCOUNTER — Other Ambulatory Visit: Payer: Self-pay

## 2021-11-10 ENCOUNTER — Ambulatory Visit (INDEPENDENT_AMBULATORY_CARE_PROVIDER_SITE_OTHER): Payer: BC Managed Care – PPO | Admitting: Psychology

## 2021-11-10 DIAGNOSIS — F4010 Social phobia, unspecified: Secondary | ICD-10-CM

## 2021-11-10 NOTE — Progress Notes (Signed)
                Alisson Rozell, PhD 

## 2021-11-10 NOTE — Progress Notes (Signed)
[place] Counselor/Therapist Progress Note  Patient ID: Ray Park, MRN: [id number],    Date: 11/10/2021  Time Spent: 12:00 - 2:30pm   Treatment Type:  Testing   Met with patient for testing session.  Patient was at the clinic and session was conducted from therapist's office in person.  Reported Symptoms: Reason for Visit /Presenting Problem: Patient presents with suspected attention and organizational deficits, currently affecting martial relations and previously affecting school and work Systems analyst.  Patient history is significant for fine motor and speech impairment along with social anxiety.  Testing is recommended to evaluate for ADHD as well as other conditions that may be affecting attention.        Mental Status Exam: Appearance:  Neat and Well Groomed     Behavior: Appropriate  Motor: Normal  Speech/Language:  Clear and Coherent and Normal Rate  Affect: Appropriate  Mood: normal  Thought process: Normal  Thought content:   WNL  Sensory/Perceptual disturbances:   WNL  Orientation: oriented to person, place, time/date, and situation  Attention: Good  Concentration: Fair  Memory: WNL  Fund of knowledge:  Good  Insight:   Good  Judgment:  Good  Impulse Control: Good   Risk Assessment: Danger to Self:  No Self-injurious Behavior: No Danger to Others: No  Behavior Observations: Patient was cooperative and displayed good effort. Attention and concentration were adequate overall, although patient frequently asked for questions to be repeated.  He occasionally took a long time to solve nonverbal problems and missed one relatively easy question.  Mood was euthymic with appropriate affect.  The results appear representative of current functioning.    Subjective: Testing included the K-BIT 2R (0.75 hrs. for testing and scoring) along with the CNS Vital signs (0.75 hrs.), Liebowitz Social Anxiety Scale (0.5 hrs.) and BRIEF-A (0.5 hrs.).     Diagnosis:No  diagnosis found.  Plan: Testing complete. Report writing to be conducted followed by  interactive feedback next session.     [name], PhD

## 2021-11-18 ENCOUNTER — Encounter: Payer: Self-pay | Admitting: Psychology

## 2021-11-18 NOTE — Progress Notes (Signed)
Ray Park is a 51 y.o. male patient. Report writing competed ( 2 hrs.) from 12:30 -   2:30pm .  Interactive feedback to be conducted next session. Report to be attached to the feedback progress note. Marland Kitchen  [name], PhD

## 2021-12-09 ENCOUNTER — Encounter: Payer: Self-pay | Admitting: Psychology

## 2021-12-09 ENCOUNTER — Ambulatory Visit: Payer: BC Managed Care – PPO | Admitting: Psychology

## 2021-12-09 DIAGNOSIS — F908 Attention-deficit hyperactivity disorder, other type: Secondary | ICD-10-CM | POA: Diagnosis not present

## 2021-12-09 DIAGNOSIS — F4010 Social phobia, unspecified: Secondary | ICD-10-CM

## 2021-12-09 NOTE — Progress Notes (Signed)
                Trey Bebee, PhD 

## 2021-12-09 NOTE — Progress Notes (Signed)
[facility] Testing Progress Note  Patient ID: Ray Park, MRN: [id number],    Date: 12/09/2021  Time Spent: 3:00 - 3:45 pm.   Treatment Type:  Testing - Feedback Session  Met with patient and spouse to review results of testing.  Patient and spouse were at home due to COVID-19 restrictions and session was conducted from therapist's office via video conferencing. Patient and spouse verbally consented to telehealth.       Reported Symptoms: Patient presents with suspected attention and organizational deficits, currently affecting martial relations and previously affecting school and work Systems analyst.  Patient history is significant for fine motor and speech impairment along with social anxiety.  Testing is recommended to evaluate for ADHD as well as other conditions that may be affecting attention.   Subjective: Interactive feedback was conducted (1 hr.).  It was discussed how patient did not meet the full criterion for ADHD as it was not pervasive across settings, along with how social anxiety and tense marital relations may be impairing focus at home.  Recommendations included discussing results with PCP, developing motivational incentives for completing simple activities at home, and seeking individual and martial counseling.  Patient and spouse expressed agreement with the results and recommendations.     Total Time of Testing: 6.5 hrs. Testing and Scoring: 2.5 hrs. Interactive Feedback:1 hr. Report Writing: 2 hrs.   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      [name], PhD

## 2021-12-09 NOTE — Progress Notes (Signed)
Psychological Testing Report - Confidential  Identifying Information:               Patient's Name:   Ray Park  Date of Birth:              April 24, 1971     Age:                51 years  MRN#:                                   [id number]      Dates of Assessment:  October 29, 2021         Purpose of Evaluation:  The purpose of the evaluation is to provide diagnostic information and treatment recommendations.     Referral Information: Mr. Ray Park was referred to [facility] for psychological testing at the request of his wife.  Mr. Ray Park reported that he and his spouse have communication issues and she suspected attention deficits as being part of his difficulty.  Mr. Ray Park reported that he can write well but he struggles interpersonally, especially with listening.  His thoughts get jumbled while listening so his interpretations of what his wife says to him are often different from what she intends.  Mr. Ray Park reported being more comfortable with writing than speaking.  He has difficulty with eye contact and trouble following multi-step directions, even when asked to repeat the instructions.  He tends to ramble when speaking, has difficulty with word retrieval at times and has difficulty with punctuality.  Mr. Ray Park's wife reported that he lacks focus and has difficulty recalling what she says to him.  His mind tends to think ahead, which keeps him from listening attentively.  He loses focus during less desirable activities.  These issues were not brought to Mr. Ray Park awareness during childhood or during school.  However, in school, he was better with book work than lab work or applied learning/mechanics.  He was described as creative and has Neurosurgeonmusical talent along with a good memory for remote dates of interest.     Relevant Background Information:  Developmental history was reported to be significant for motor and speech difficulty. There were no early  delays, but Mr. Ray Park required Speech Therapy during the 5th grade for articulation, despite doing well academically.  Regarding gross motor skills, Mr. Ray Park indicated having poor mechanical skills and motor planning but performing adequately with sports.  Difficulty with fine motor skills were reported including trouble tying knots, closing bags, using scissors, and folding clothes.  Regarding speech, Mr. Ray Park is still not completely comfortable with verbal communication, mumbling at times and having trouble with word retrieval.  Self-care skills were reported to be well developed.  Independent skills were reported to be good, but his wife was reported to be very vigilant with household and family activities and she keeps him on task. Socially, Mr. Ray Park reported being introverted but having a few close friends.                 Medical history was reported to be significant for hypercholesterolemia and hyperglyceridemia.  Past Surgical History includes an ACL replacement and an ECHOCARDIOGRAPHY related to a benign heart murmur.  Current medications include COVID-19 mRNA bivalent vaccine.  Mr. Ray Park denied a history of seizures, concussion or head injuries and does not have any known allergies.   Previous psychological history is significant  for attention/focus issues.  He has not seen any outpatient psychotherapy providers and has never had psychiatric hospitalization.  He has not participated in previous psychological testing but took an online test suggesting he is at-risk for Attention Deficit Hyperactivity Disorder (ADHD).                   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  He performed well in academically until the 9th grade when his family moved, and he had to attend a new school. His grades were B-C during high  school. Strengths include being easy to get along with but lacking assertiveness at times.  He is well rounded and has wide variety of skills and experiences. Ray Park is currently working at [employer], providing building supplies for contractors.  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  Ray Park struggled with customer relations, so he was taken off that area.  The work is easier for him now that he is just doing inventory.  Ray Park struggles with multitasking, including difficulty switching his mindset from a project to customer service.  He had trouble regulating his time and frequently worked overtime.  He stays organized at work but often gets to work and leaves late.  He would be up to one hour late in leaving work if his wife did not prompt him to come home.  Leisure activities include writing poetry, music, song writing, checkers, and photography.  He reported good in these areas but not great.  He also enjoys visiting zoos, aquariums, Estate manager/land agent world and traveling in general.    Mr. Ray Park currently lives with his wife [name].  This is his first marriage.  They have been married for 6 years and have known each other for 10 years.  Relations were reported to be strained related to problems with communication.  Mr. Ray Park wife feels that he does not listen to her.  Otherwise, they reported having a good relationship.  They do not have any children.  Support systems include his spouse, mother-in-law, and close friends he has known for decades, although he hasn't seen friends much since the COVID-19 pandemic started in 2020.  Family mental health history was reported to be unremarkable.  Mr. Ray Park indicated that he enjoyed his childhood overall.  He denied a history  of abuse or any trauma.  Recent stressors include his marital relations, stating that he functions more effectively, and is less anxious, at work than at home.   Presenting Symptomology:  Mr. Vazquez reported that he falls asleep easily and is a sound sleeper.  He denied changes in appetite.  Energy was reported to be strong through most of the day.  Episodes of prolonged sadness/depression were denied, as were panic attacks or specific worries/fears.  Some general worry and social anxiety were reported including ordering in drive thru restaurants and taking phone calls.  Mania was denied.  Mr. Sturdevant does not engage in obsessive thought but occasionally exhibits compulsive behavior (excessive checking).  Mr. Parish reported having trouble paying attention since his school days, as he would daydream often.  He gets easily distracted, including having several majors  and attending three universities during college.  He is frequently forgetting instructions and activities per his wife.  He loses items but not the same ones repeatedly.  He reported having good organization at work but not home.  He denied hyperactivity or impulsivity.  Mr. Ray Park reported having some close friendships but more introverted in general.  He adequately interprets social cues and does not engage in repetitive speech/behavior.  He does not have any overly intense interests but struggles with changing habits and routines.  Sensory hypersensitivity was denied.                      Procedures Administered: [facility] Brief Intelligence Test - 2 7  9  Client: Delane GingerJames Park       DOB:  05-12-1971        MRN# [id number] [facility] [address] [place], [place], [id number] CNS Vital Signs 7  9 Client: Delane GingerJames Park       DOB:  05-12-1971        MRN# [id number] [facility] [address] [place], [place], [id number] Behavior Rating Inventory for Executive Function - Adult  [facility](BRIEF-A) 7  9 Client: Delane GingerJames Park       DOB:  05-12-1971        MRN# [id number] [facility] [address] [place], [place], [id number] Verne SpurrLiebowitz Social Anxiety Scale7  9 Client: Delane GingerJames Park       DOB:  05-12-1971        MRN# [id number] [facility] [address] [place], [place], [id number]  Adult ADHD Self Report Scale  7  9 Client: Delane GingerJames Park       DOB:  05-12-1971        MRN# [id number] [facility] [address] [place], [place], [id number] Adult OCD Inventory 7  9 Client: Delane GingerJames Park       DOB:  05-12-1971        MRN# [id number] [facility] [address] [place], [place], [id number] Depression, Anxiety, and Stress Scale 7  9 Client: Delane GingerJames Park       DOB:  05-12-1971        MRN# [id number] [facility] [address] [place], [place], [id number]  Behavioral Observations:  Mr. Ray Park was cooperative and displayed good effort. Attention and concentration were adequate overall, although Mr. Ray Park frequently asked for questions to be repeated.  He occasionally took a long time to solve nonverbal problems and missed one relatively easy question.  Mood was euthymic with appropriate affect.  The results appear representative of current functioning.  Brief mental status indicated typical general orientation and alertness.  Recent, remote, immediate, and delayed memory were intact while working memory was mildly impaired.  Judgement and insight were good with typical abstract thinking.  Hallucinations, delusions, and thoughts of self-harm were denied.   Test Results and Interpretation:   General Intellectual Functioning: The K-BIT 2 was used to assess Mr. Ray Park's performance across two areas of cognitive ability. When interpreting these scores, it is important to view the results as a snapshot of current intellectual functioning. As measured by the K-BIT 2, Mr. Depaola's  Composite IQ score fell within the above average range when compared to same age peers (CIQ = 118).  Mr. Ray Park's performance was relatively consistent across the Primary Index Scores, as Verbal Comprehension (VCI = 112) and Perceptual Reasoning (PRI = 119) were high average and above average respectively.  This indicates strong  and relatively equal visual learning ability and language understanding.  On individual subtests, Mr. Emma performed within the high average range for inferential thinking (riddles) and verbal knowledge, with high average visual pattern analysis (matrices).  Overall, Mr. Sermeno appears to have well developed verbal and nonverbal comprehension ability.    Carlos American Brief Intelligence Test - 2 Composite Score Summary  Composite Scores  Sum of Scaled Scores Composite Score Percentile Rank 90% Confidence Interval Qualitative Description  Verbal Comprehension  VC  97         112  79  105-118        High Average  Nonverbal Reasoning PR 44 119 90 113-123  Above Average  Composite IQ  FSIQ - 118 88 113-122      Above Average   K-BIT 2 Continued Domain Subtest Name  Total Raw Score Scaled Score Percentile Rank  Verbal Verbal Knowledge VK 56 12 75  Comprehension Riddles Ri 41 12 75   Attention and Processing: The results of the CNS Vital Signs testing indicated below average neurocognitive processing ability, at a level well below measured intellectual ability (above average).  Regarding areas related to attention problems, simple attention, complex attention, cognitive flexibility, and executive function were very low while sustained attention was average.  These are the domains most closely associated with attention deficits.  Motor/psychomotor speed and processing speed were average, while reaction time was low, indicating age typical hand speed responsiveness, with slow responsiveness on computerized measures.  Visual memory and verbal memory were average while  working memory was a mild weakness in the low average range.  The results suggest that Mr. Klich appears to have well below age typical ability attending to simple and complex tasks with typical thinking speed and memory but slow responsiveness.  All measures were deemed valid, although there was a temporary glitch during the shifting attention test which affects the complex attention, cognitive flexibility, and executive function scores.                                                        CNS Vital Signs   Domain Scores Standard Score %ile Validity Indicator Guideline  Neurocognitive Index 82 12 Yes Below Average  Composite Memory 102 55 Yes Average  Verbal Memory 106 66 Yes Average  Visual Memory 97 42 Yes Average  Psychomotor speed 107 68 Yes Average  Reaction Time 75 5 Yes Low  Complex Attention 67 1 Yes Very Low  Cognitive Flexibility 58 1 Yes Very Low  Processing Speed 95 37 Yes Average  Executive Function 57 1 Yes Very Low  Working Memory 88 21 Yes Low Average  Sustained Attention 98 45 Yes Average  Simple Attention  61 1 Yes Very Low  Motor Speed 113 81 Yes High Average   Executive Function:  Mr. Basso completed the Self-Report Form of the Behavior Rating Inventory of Executive Function-Adult Version (BRIEF-A) on 11/10/2021. There are no missing item responses in the protocol. Ratings of Mr. Bonzo self-regulation do not appear overly negative. Items were completed in a reasonable fashion, suggesting that the respondent did not respond to items in a haphazard or extreme manner. Responses are reasonably consistent. In the context of these validity considerations, ratings of Mr. Suarez everyday executive function suggest no current concerns. The overall index, the Hospital doctor (  GEC), was within the non-elevated range for age (GEC T = 51, %ile = 67). The Behavioral Regulation (BRI) and Metacognition (MI) Indexes were within normal limits (BRI T = 49, %ile = 57  and MI T = 53, %ile = 68).  Within these summary indicators, all the individual scales are valid. None of the individual BRIEF-A scales were elevated, suggesting that Mr. Levels views himself as having appropriate ability to self-regulate, including the ability to inhibit impulsive responses, adjust to changes in routine or task demands, modulate emotions, monitor social behavior, initiate problem solving or activity, sustain working memory, plan and organize problem-solving approaches, attend to task-oriented output, and organize environment and materials.  The results were not indicative of organizational problems associated with ADHD, but suggests some difficulty with shifting attention and getting tasks started.      BRIEF-A Score Summary Table Scale/Index Raw score T score Percentile  Inhibit 11 50 65  Shift 11 62 90  Emotional Control 12 45 49  Self-Monitor 7 42 32  Behavioral Regulation Index (BRI) 41 49 57  Initiate 14 60 89  Working Memory 12 55 78  Plan/Organize 16 57 80  Task Monitor 8 48 53  Organization of Materials 9 41 26  Metacognition Index (MI) 59 53 68  Global Executive Composite (GEC) 100 51 67   Behavioral - Emotional Functioning: Ratings of behavioral functioning indicated little difficulty with attention and organizational, along with few problems regarding physical restlessness and impulse control.  On the Adult ADHD Self Report Scale, Ms. Cooper positively endorsed, as occurring often or very often, 0 of 9 items for intention/poor organization and 1 of 9 items for hyperactivity and poor impulse control.  Additionally, just 1 of 6 critical items was highly endorsed including frequent fidgeting, while problems starting tasks and trouble remembering appointments/meetings were endorse as occurring sometimes.  Endorsement of at least 5 items in either category along with 4 critical items is considered at-risk for ADHD.   Ratings of emotional functioning indicated little  emotional distress.  On the On the Liebowitz Social Anxiety Scale, Mr. Jurgens's score (30) fell within the mildly high range for social anxiety (30-49).  Mr. Fillmore highly endorsed (moderate or higher) 3 of the 24 items with the most difficulty regarding performing in front of an audience, going to parties, and hosting a party.  Mild difficulty was noted in several other areas including making telephone calls, speaking to those in authority, speaking with unfamiliar people, speaking at meetings, having attention drawn to you, and resisting persistent individuals.  Mr. Uhls's score on the Adult OCD Inventory were in the 'not a problem' range as were scores for depression, anxiety, and stress.  On these measures, only one item was endorsed as occurring frequently (trouble with decision making).         Summary:   Mr. Schneller was evaluated during January 2023 related to suspicion of attention deficits.  Mr. Sookdeo presents with suspected attention and organizational deficits, currently affecting martial relations and previously affecting school and work performance.  Patient history is significant for fine motor and speech impairment along with social anxiety.  Testing is recommended to evaluate for ADHD as well as other conditions that may be affecting attention.  Test results indicated above average overall intelligence (K-BIT 2), with relatively equally developed Verbal Comprehension and Nonverbal Reasoning.  Neurocognitive skills testing indicated below age typical attention for simple and complex tasks with typically developed memory but slow responsiveness.  Low scores on complex attention,  shifting attention, and executive function appear to have been influenced by a computer glitch and should be interpreted with caution.  Ratings of executive function (EF) indicated age typical functioning overall, with only mildly elevated scores in the areas of shifting attention and initiation.  Ratings for  emotional functioning suggested mildly high rates of social anxiety, with little impairment regarding attention, organization, depression, general anxiety, or stress.  Mr. Gowin believed that his attention problems at home were mostly anxiety related as he does not seem to have these at work. Additionally, developmental history indicated few problems prior to age 34, although frequent day dreaming was reported during childhood, a decreased in grades was reported during high school, and Mr. Apolinar went through three schools and several majors before completing college.  While Mr. Guidry does not fully meet the criterion for ADHD, he appears to have some processing deficits consistent with this condition and it is possible that he was able to compensate for attention problems in lower grades with high intelligence, suffering more when greater organization and independence was required. 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  Recommendations include discussing results with Primary Care Physician or psychiatrist, developing a visual organization system for home, and considering individual and marital counseling.  See below for further recommendations.        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  While medication for attention deficits may be helpful, although it could increase anxiety as well.  Consider medication to manage anxiety or a nonstimulant medication for ADHD such as Intuniv/Guanfacine.  Attention deficits are not currently pervasive so Mr.  Elsen may wish to try therapeutic and other strategies to improve focus (e.g., mindfulness/awareness and training in organizational skills) to improve attention prior to considering medication.      Individual counseling is recommended to help Mr. Trudel with managing social anxiety, lessening hesitancy, and improving with decision making. Mr. Lienhard would benefit from a structured therapy approach that focuses on the teaching of reading emotional expression in others and managing social anxiety.  Marital therapy is recommended to help Mr. Cislo and his wife improve communication within their relationship.   Mental alertness/energy can also be raised by increasing exercise, improving sleep, eating a healthy diet, and managing depression/stress.  Consult with a physician regarding any changes to physical regimen.  Remembering verbal information can be enhanced by asking the speaker to chunk the information into smaller segments and using visual reminders such as calendars, schedules, task lists, and reminder notes.  These can be programmed into a computer, smart phone, or tablet for convenient access.   Memory for reading can be enhanced by reading out loud or underlying key words and passages while reading.  Compensatory strategies for Executive function impairment can take several forms including using external aides (e.g., use of a notebook), learning cognitive strategies (e.g., verbalization), and making environmental modifications (e.g., keeping workspace clutter-free). Research has demonstrated that both healthy adults as well as individuals who have executive deficits commonly rely on external aids for executive and other cognitive processes. More frequent use of aides or strategies may be helpful when it comes to memory,  and this also may hold true for executive dysfunction.     Please contact this provider if further information is needed.     Salvatore Decent Ellysia Char, Ph.D. Licensed  Psychologist - [facility] [place] Licensed psychologist [phone]               [name], PhD

## 2021-12-18 ENCOUNTER — Encounter: Payer: Self-pay | Admitting: Psychology

## 2021-12-18 ENCOUNTER — Other Ambulatory Visit: Payer: Self-pay

## 2021-12-18 ENCOUNTER — Ambulatory Visit (INDEPENDENT_AMBULATORY_CARE_PROVIDER_SITE_OTHER): Payer: BC Managed Care – PPO | Admitting: Psychology

## 2021-12-18 DIAGNOSIS — F908 Attention-deficit hyperactivity disorder, other type: Secondary | ICD-10-CM | POA: Diagnosis not present

## 2021-12-18 DIAGNOSIS — F4010 Social phobia, unspecified: Secondary | ICD-10-CM | POA: Diagnosis not present

## 2021-12-18 NOTE — Progress Notes (Signed)
                Ray Mcbrearty, PhD 

## 2021-12-18 NOTE — Progress Notes (Signed)
[facility] Counselor/Therapist Progress Note ? ?Patient ID: Ray Park, MRN: [id number],   ? ?Date: 12/18/2021 ? ?Time Spent: 4:00 - 4:45pm  ? ?Treatment Type: Individual Therapy ? ?Met with patient for therapy session.  Patient was at the clinic and session was conducted from therapist's office in person. ?   ?Reported Symptoms: Patient appears to have some processing deficits consistent with ADHD, and it is possible that he was  ?able to compensate for attention problems in lower grades with high intelligence, but he is suffering more when greater organization and independence was required.  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 Recommendations include participating individual counseling to address these issues.  ? ?Mental Status Exam: ?Appearance:  Neat and Well Groomed     ?Behavior: Appropriate and Sharing  ?Motor: Normal  ?Speech/Language:  Clear and Coherent and Normal Rate  ?Affect: Appropriate and Constricted  ?Mood: euthymic  ?Thought process: normal  ?Thought content:   WNL  ?Sensory/Perceptual disturbances:   WNL  ?Orientation: oriented to person, place, time/date, and situation  ?Attention: Good  ?Concentration: Good  ?Memory: WNL  ?Fund of knowledge:  Good  ?Insight:   Good  ?Judgment:  Good  ?Impulse Control: Good  ? ?Risk Assessment: ?Danger to Self:  No ?Self-injurious Behavior: No ?Danger to Others: No ?Duty to Warn:no ?Physical Aggression / Violence:No  ?Access to Firearms a concern: No  ?Gang Involvement:No  ? ?Subjective: patient previously evaluated by this provided and diagnosed with other specified ADHD and social anxiety.  He indicated that these difficulties have led to strained relationships with his marriage and his anxiety/avoidance of having difficult conversations has contributed to difficult family  relations.  Patient indicated wanting to learn how to stay more focused and speak his mind more freely.    ? ?Interventions: Cognitive Behavioral Therapy, Assertiveness/Communication, Mindfulness Meditation, and Acceptance and commitment Therapy ? ?Diagnosis:Social anxiety disorder ? ?Attention deficit hyperactivity disorder (ADHD), other type ? ?Plan: Regular therapy sessions to be scheduled focusing on increasing awareness, assertiveness, and coping skills in order to lessen avoidance of difficult conversations.  ? ?Treatment plan was reviewed with patient/parents.  Patient/parents expressed agreement with the goals, objectives, and treatment methods identified in the treatment plan.   ? ?Treatment Plan ?Client Abilities/Strengths  ?Intelligence and thoughtful  ? ?Client Treatment Preferences  ?In person sessions  ? ?Client Statement of Needs  ?Patient appears to have some processing deficits consistent with ADHD, and it is possible that he was  ?able to compensate for attention problems in lower grades with high intelligence, but he is suffering more when greater organization and independence was required. 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 Recommendations include  ?participating individual counseling to address these issues.  ? ? ?Problems Addressed  ?Social Anxiety, Attention Deficit Disorder (ADD) - Adult  ? ?Goals ?1. Interact socially without undue fear or anxiety. ?Objective ?Learn and implement calming and coping strategies to manage anxiety symptoms during moments of social anxiety and lead to a more relaxed state in general. ?Target Date: 2022-09-19  ?Progress: 0  ? ?Objective ?Identify, challenge, and replace biased, fearful self-talk with reality-based, positive self-talk. ?Target Date: 2022-12-19  ?Progress: 0  ? ?2. Sustain  attention and  concentration for consistently longer periods of  ?time. ?Objective ?Learn and implement skills to reduce the disruptive influence of distractibility. ?Target Date: 2022-09-19  ?Progress: 0  ? ?Objective ?Learn and implement skills to reduce procrastination and avoidance. ?Target Date: 2022-12-19  ?Progress: 0   ? ?[name], PhD ? ? ? ?

## 2022-01-01 ENCOUNTER — Ambulatory Visit: Payer: BC Managed Care – PPO | Admitting: Psychology

## 2022-01-20 ENCOUNTER — Encounter: Payer: Self-pay | Admitting: Psychology

## 2022-01-20 ENCOUNTER — Ambulatory Visit (INDEPENDENT_AMBULATORY_CARE_PROVIDER_SITE_OTHER): Payer: BC Managed Care – PPO | Admitting: Psychology

## 2022-01-20 DIAGNOSIS — F4010 Social phobia, unspecified: Secondary | ICD-10-CM

## 2022-01-20 DIAGNOSIS — F908 Attention-deficit hyperactivity disorder, other type: Secondary | ICD-10-CM

## 2022-01-20 NOTE — Progress Notes (Signed)
[facility] Counselor/Therapist Progress Note ? ?Patient ID: Ray Park, MRN: [id number],   ? ?Date: 01/20/2022 ? ?Time Spent: 4:00 - 4:45pm  ? ?Treatment Type: Individual Therapy ? ?Met with patient for therapy session.  Patient was at the clinic and session was conducted from therapist's office in person. ?   ?Reported Symptoms: Patient appears to have some processing deficits consistent with ADHD, and it is possible that he was  ?able to compensate for attention problems in lower grades with high intelligence, but he is suffering more when greater organization and independence was required.  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 Recommendations include participating individual counseling to address these issues.  ? ?Mental Status Exam: ?Appearance:  Neat and Well Groomed     ?Behavior: Appropriate and Sharing  ?Motor: Normal  ?Speech/Language:  Clear and Coherent and Normal Rate  ?Affect: Full and appropriate  ?Mood: euthymic  ?Thought process: normal  ?Thought content:   WNL  ?Sensory/Perceptual disturbances:   WNL  ?Orientation: oriented to person, place, time/date, and situation  ?Attention: Good  ?Concentration: Good  ?Memory: WNL  ?Fund of knowledge:  Good  ?Insight:   Good  ?Judgment:  Good  ?Impulse Control: Good  ? ?Risk Assessment: ?Danger to Self:  No ?Self-injurious Behavior: No ?Danger to Others: No ?Duty to Warn:no ?Physical Aggression / Violence:No  ?Access to Firearms a concern: No  ?Gang Involvement:No  ? ?Subjective: Patient reported being hesitant with asserting himself and having difficult conversations to the detriment of having a reciprocal marriage (wife more controlling) and having important conversations with his aging father.     ? ?Interventions: Cognitive Behavioral Therapy, Assertiveness/Communication,  Mindfulness Meditation, and Acceptance and commitment Therapy  - Emphasis on this session was on acceptance of the unpleasant emotion and commitment to taking action despite the discomfort.  Refraining form judging self harshly for unpleasant reactions from others was also discussed.   ? ?Diagnosis:Social anxiety disorder ? ?Attention deficit hyperactivity disorder (ADHD), other type ? ?Plan: Regular therapy sessions to be scheduled focusing on increasing awareness, assertiveness, and coping skills in order to lessen avoidance of difficult conversations.  ? ?Treatment plan was reviewed with patient/parents.  Patient/parents expressed agreement with the goals, objectives, and treatment methods identified in the treatment plan.   ? ?Treatment Plan ?Client Abilities/Strengths  ?Intelligence and thoughtful  ? ?Client Treatment Preferences  ?In person sessions  ? ?Client Statement of Needs  ?Patient appears to have some processing deficits consistent with ADHD, and it is possible that he was  ?able to compensate for attention problems in lower grades with high intelligence, but he is suffering more when greater organization and independence was required. 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 Recommendations include  ?participating individual counseling to address these issues.  ? ? ?Problems Addressed  ?Social Anxiety, Attention Deficit Disorder (ADD) - Adult  ? ?Goals ?1. Interact socially without undue fear or anxiety. ?Objective ?Learn and implement calming and coping strategies to manage anxiety symptoms during moments of social anxiety and lead to a more relaxed state in general. ?Target Date: 2022-09-19  ?Progress: 10  ? ?Objective ?Identify, challenge, and replace biased, fearful self-talk with reality-based, positive self-talk. ?Target Date:  2022-12-19  ?  Progress: 10  ? ?2. Sustain attention and concentration for consistently longer periods of  ?time. ?Objective ?Learn and implement skills to reduce the disruptive influence of distractibility. ?Target Date: 2022-09-19  ?Progress: 0  ? ?Objective ?Learn and implement skills to reduce procrastination and avoidance. ?Target Date: 2022-12-19  ?Progress: 5  ? ?[name], PhD ? ? ? ? ? ? ? ? ? ? ? ? ? ? ? ? ? ?[name], PhD ?

## 2022-01-29 ENCOUNTER — Ambulatory Visit (INDEPENDENT_AMBULATORY_CARE_PROVIDER_SITE_OTHER): Payer: BC Managed Care – PPO | Admitting: Psychology

## 2022-01-29 ENCOUNTER — Encounter: Payer: Self-pay | Admitting: Psychology

## 2022-01-29 DIAGNOSIS — F4010 Social phobia, unspecified: Secondary | ICD-10-CM | POA: Diagnosis not present

## 2022-01-29 DIAGNOSIS — F908 Attention-deficit hyperactivity disorder, other type: Secondary | ICD-10-CM

## 2022-01-29 NOTE — Progress Notes (Signed)
[facility] Counselor/Therapist Progress Note ? ?Patient ID: Ray Park, MRN: [id number],   ? ?Date: 01/29/2022 ? ?Time Spent: 4:00 - 4:57pm  ? ?Treatment Type: Individual Therapy ? ?Met with patient for therapy session.  Patient was at the clinic and session was conducted from therapist's office in person. ?   ?Reported Symptoms: Patient appears to have some processing deficits consistent with ADHD, and it is possible that he was  ?able to compensate for attention problems in lower grades with high intelligence, but he is suffering more when greater organization and independence was required.  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 Recommendations include participating individual counseling to address these issues.  ? ?Mental Status Exam: ?Appearance:  Casually dressed and adequately groomed      ?Behavior: Appropriate and Sharing  ?Motor: Normal  ?Speech/Language:  Clear and Coherent and Normal Rate  ?Affect: Restricted  ?Mood: euthymic  ?Thought process: normal  ?Thought content:   WNL  ?Sensory/Perceptual disturbances:   WNL  ?Orientation: oriented to person, place, time/date, and situation  ?Attention: Good  ?Concentration: Good  ?Memory: WNL  ?Fund of knowledge:  Good  ?Insight:   Good  ?Judgment:  Good  ?Impulse Control: Good  ? ?Risk Assessment: ?Danger to Self:  No ?Self-injurious Behavior: No ?Danger to Others: No ?Duty to Warn:no ?Physical Aggression / Violence:No  ?Access to Firearms a concern: No  ?Gang Involvement:No  ? ?Subjective: Patient reported being being able to speak with his father about his driving without any negative repercussions.  He now wants to try to do this with his wife, as he mentioned being too accommodating to her which has led to him contributing little to the relationship.  Patient reported being  indecisive since childhood with the anxiety getting in the way of his thinking about what he truly wanted.  He also mentioned having trouble listening to his wife as he starts to tune her out when she goes into excessively detail or disagrees with what she says instead of saying he disagrees.    ? ?Interventions: Cognitive Behavioral Therapy, Assertiveness/Communication, Mindfulness Meditation, and Acceptance and commitment Therapy  - Emphasis on this session was acknowledging the anxious thoughts then allowing himself to think of what he truly wants or believes if the anxiety was not there.   ? ?Diagnosis:Social anxiety disorder ? ?Attention deficit hyperactivity disorder (ADHD), other type ? ?Plan: Regular therapy sessions to be scheduled focusing on increasing awareness, assertiveness, and coping skills in order to lessen avoidance of difficult conversations.  ? ?Treatment plan was reviewed with patient/parents.  Patient/parents expressed agreement with the goals, objectives, and treatment methods identified in the treatment plan.   ? ?Treatment Plan ?Client Abilities/Strengths  ?Intelligence and thoughtful  ? ?Client Treatment Preferences  ?In person sessions  ? ?Client Statement of Needs  ?Patient appears to have some processing deficits consistent with ADHD, and it is possible that he was able to compensate for attention problems in lower grades with high intelligence, but he is suffering more when greater organization and independence was required. 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 Recommendations include participating individual counseling to address these issues.  ? ?Problems Addressed  ?Social Anxiety, Attention Deficit Disorder (ADD) -  Adult  ? ?Goals ?1. Interact socially without undue fear or anxiety. ?Objective ?Learn and implement  calming and coping strategies to manage anxiety symptoms during moments of social anxiety and lead to a more relaxed state in general. ?Target Date: 2022-09-19  ?Progress: 20  ? ?Objective ?Identify, challenge, and replace biased, fearful self-talk with reality-based, positive self-talk. ?Target Date: 2022-12-19  ?Progress: 20  ? ?2. Sustain attention and concentration for consistently longer periods of  ?time. ?Objective ?Learn and implement skills to reduce the disruptive influence of distractibility. ?Target Date: 2022-09-19  ?Progress:10  ? ?Objective ?Learn and implement skills to reduce procrastination and avoidance. ?Target Date: 2022-12-19  ?Progress: 15  ? ?Ray Pines, PhD ? ? ? ? ? ? ? ? ? ? ? ? ? ? ? ? ? ? ? ?Ray Pines, PhD ?

## 2022-02-12 ENCOUNTER — Encounter: Payer: Self-pay | Admitting: Psychology

## 2022-02-12 ENCOUNTER — Ambulatory Visit (INDEPENDENT_AMBULATORY_CARE_PROVIDER_SITE_OTHER): Payer: BC Managed Care – PPO | Admitting: Psychology

## 2022-02-12 DIAGNOSIS — F4010 Social phobia, unspecified: Secondary | ICD-10-CM

## 2022-02-12 DIAGNOSIS — F908 Attention-deficit hyperactivity disorder, other type: Secondary | ICD-10-CM | POA: Diagnosis not present

## 2022-02-12 NOTE — Progress Notes (Signed)
[facility] Counselor/Therapist Progress Note ? ?Patient ID: Ray Park, MRN: [id number],   ? ?Date: 02/12/2022 ? ?Time Spent: 10:15 - 11:00 am  ? ?Treatment Type: Individual Therapy ? ?Met with patient for therapy session.  Patient was at initially at home and session was initially conducted from therapist's office via video.  Patient consented to tele-health but patient felt uncomfortable with the video session and came to the office to complete the session in person.    Video session was 5 minutes while in person session was 40 minutes. ?   ?Reported Symptoms: Patient appears to have some processing deficits consistent with ADHD, and it is possible that he was  ?able to compensate for attention problems in lower grades with high intelligence, but he is suffering more when greater organization and independence was required. 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 Recommendations include participating individual counseling to address these issues.  ? ?Mental Status Exam: ?Appearance:  Casually dressed and neatly groomed      ?Behavior: Appropriate and Sharing  ?Motor: Normal  ?Speech/Language:  Clear and Coherent and Normal Rate  ?Affect: Full, appropriate  ?Mood: euthymic  ?Thought process: normal  ?Thought content:   WNL  ?Sensory/Perceptual disturbances:   WNL  ?Orientation: oriented to person, place, time/date, and situation  ?Attention: Good  ?Concentration: Good  ?Memory: WNL  ?Fund of knowledge:  Good  ?Insight:   Good  ?Judgment:  Good  ?Impulse Control: Good  ? ?Risk Assessment: ?Danger to Self:  No ?Self-injurious Behavior: No ?Danger to Others: No ?Duty to Warn:no ?Physical Aggression / Violence:No  ?Access to Firearms a concern: No  ?Gang Involvement:No  ? ?Subjective: Patient reported making some progress in asserting his  thoughts and being more decisive with his wife until last night, when she became upset with him after he struggled to remove a mattress cover and left a spoon out on the counter.  Patient reported that wife wishes for patient to address attention deficits more than social anxiety during these sessions while patient believes that wife' expectations and demands are excessive.   ? ?Interventions: Cognitive Behavioral Therapy, Assertiveness/Communication, Mindfulness Meditation, and Acceptance and commitment Therapy  - Emphasis on this session was improving awareness through mindfulness practices along with patient seeking more reciprocity and compromise from his spouse in their relationship.    ? ?Diagnosis:Social anxiety disorder ? ?Attention deficit hyperactivity disorder (ADHD), other type ? ?Plan: Regular therapy sessions to continue focusing on increasing awareness, assertiveness, and coping skills in order to lessen avoidance of difficult conversations.  ? ?Treatment plan was reviewed with patient/parents.  Patient/parents expressed agreement with the goals, objectives, and treatment methods identified in the treatment plan.   ? ?Treatment Plan ?Client Abilities/Strengths  ?Intelligence and thoughtful  ? ?Client Treatment Preferences  ?In person sessions  ? ?Client Statement of Needs  ?Patient appears to have some processing deficits consistent with ADHD, and it is possible that he was able to compensate for attention problems in lower grades with high intelligence, but he is suffering more when greater organization and independence was required. 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 Recommendations include participating individual counseling to address these issues.  ? ?Problems Addressed  ?Social Anxiety,  Attention Deficit Disorder (ADD) - Adult   ? ?Goals ?1. Interact socially without undue fear or anxiety. ?Objective ?Learn and implement calming and coping strategies to manage anxiety symptoms during moments of social anxiety and lead to a more relaxed state in general. ?Target Date: 2022-09-19  ?Progress: 30  ? ?Objective ?Identify, challenge, and replace biased, fearful self-talk with reality-based, positive self-talk. ?Target Date: 2022-12-19  ?Progress: 30  ? ?2. Sustain attention and concentration for consistently longer periods of  ?time. ?Objective ?Learn and implement skills to reduce the disruptive influence of distractibility. ?Target Date: 2022-09-19  ?Progress:20  ? ?Objective ?Learn and implement skills to reduce procrastination and avoidance. ?Target Date: 2022-12-19  ?Progress: 25  ? ?[name], PhD ? ? ? ? ? ? ? ? ? ? ? ? ? ? ? ? ? ? ? ? ? ? ? ? ? ? ? ? ? ? ? ? ? ?

## 2022-02-26 ENCOUNTER — Encounter: Payer: Self-pay | Admitting: Psychology

## 2022-02-26 ENCOUNTER — Ambulatory Visit (INDEPENDENT_AMBULATORY_CARE_PROVIDER_SITE_OTHER): Payer: BC Managed Care – PPO | Admitting: Psychology

## 2022-02-26 DIAGNOSIS — F4010 Social phobia, unspecified: Secondary | ICD-10-CM | POA: Diagnosis not present

## 2022-02-26 DIAGNOSIS — F908 Attention-deficit hyperactivity disorder, other type: Secondary | ICD-10-CM

## 2022-02-26 NOTE — Progress Notes (Signed)
[place] [facility] Counselor/Therapist Progress Note  Patient ID: Ray Park, MRN: [id number],    Date: 02/26/2022  Time Spent: 4:00 - 4:45 pm   Treatment Type: Individual Therapy  Met with patient for therapy session.  Patient was at the clinic and session was conducted from the therapist's office in person.    Reported Symptoms: Patient appears to have some processing deficits consistent with ADHD, and it is possible that he was  able to compensate for attention problems in lower grades with high intelligence, but he is suffering more when greater organization and independence was required. 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 Recommendations include participating individual counseling to address these issues.   Mental Status Exam: Appearance:  Casually dressed and neatly groomed      Behavior: Appropriate and Sharing  Motor: Normal  Speech/Language:  Clear and Coherent and Normal Rate  Affect: Full, appropriate  Mood: Frustrated  Thought process: normal  Thought content:   WNL  Sensory/Perceptual disturbances:   WNL  Orientation: oriented to person, place, time/date, and situation  Attention: Good  Concentration: Good  Memory: WNL  Fund of knowledge:  Good  Insight:   Good  Judgment:  Good  Impulse Control: Good   Risk Assessment: Danger to Self:  No Self-injurious Behavior: No Danger to Others: No Duty to Warn:no Physical Aggression / Violence:No  Access to Firearms a concern: No  Gang Involvement:No   Subjective: Patient reported being frustrated with consistently trying to assert his needs.  He indicated often holding his frustration to himself until it gets too be too much and all of his concerns come out at once which upset his wife and she ends up dismissing his concerns as ranting.      Interventions: Cognitive Behavioral Therapy, Assertiveness/Communication, Mindfulness Meditation, and Acceptance and commitment Therapy  - Emphasis on this session was voicing concerns when they occur despite his anxiety about how others will respond.  This will allow him to say them more calmly and possibly be better received.     Diagnosis:Social anxiety disorder  Attention deficit hyperactivity disorder (ADHD), other type  Plan: Regular therapy sessions to continue focusing on increasing awareness, assertiveness, and coping skills in order to lessen avoidance of difficult conversations.   Treatment plan was reviewed with patient/parents.  Patient/parents expressed agreement with the goals, objectives, and treatment methods identified in the treatment plan.    Treatment Plan Client Abilities/Strengths  Intelligence and thoughtful   Client Treatment Preferences  In person sessions   Client Statement of Needs  Patient appears to have some processing deficits consistent with ADHD, and it is possible that he was able to compensate for attention problems in lower grades with high intelligence, but he is suffering more when greater organization and independence was required. 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 Recommendations include participating individual counseling to address these issues.   Problems Addressed  Social Anxiety, Attention Deficit Disorder (ADD) - Adult   Goals 1. Interact socially without undue fear or anxiety. Objective Learn and implement calming and coping strategies to manage anxiety symptoms during moments of social anxiety and lead to a more relaxed state in general. Target Date: 2022-09-19  Progress: 40  Objective Identify, challenge, and replace biased, fearful self-talk with reality-based,  positive self-talk. Target Date: 2022-12-19  Progress: 40   2. Sustain attention and concentration for consistently longer periods of  time. Objective Learn and implement skills to reduce the disruptive influence of distractibility. Target Date: 2022-09-19  Progress:30   Objective Learn and implement skills to reduce procrastination and avoidance. Target Date: 2022-12-19  Progress: [place], PhD

## 2022-03-12 ENCOUNTER — Ambulatory Visit (INDEPENDENT_AMBULATORY_CARE_PROVIDER_SITE_OTHER): Payer: BC Managed Care – PPO | Admitting: Psychology

## 2022-03-12 ENCOUNTER — Encounter: Payer: Self-pay | Admitting: Psychology

## 2022-03-12 DIAGNOSIS — F4010 Social phobia, unspecified: Secondary | ICD-10-CM

## 2022-03-12 DIAGNOSIS — F908 Attention-deficit hyperactivity disorder, other type: Secondary | ICD-10-CM | POA: Diagnosis not present

## 2022-03-12 NOTE — Progress Notes (Signed)
[facility] Counselor/Therapist Progress Note  Patient ID: Ray Park, MRN: [id number],    Date: 03/12/2022  Time Spent: 2:00 - 2:45 pm   Treatment Type: Individual Therapy  Met with patient for therapy session.  Patient was at the clinic and session was conducted from the therapist's office in person.    Reported Symptoms: Patient appears to have some processing deficits consistent with ADHD, and it is possible that he was  able to compensate for attention problems in lower grades with high intelligence, but he is suffering more when greater organization and independence was required. 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 Recommendations include participating individual counseling to address these issues.   Mental Status Exam: Appearance:  Casually dressed and neatly groomed      Behavior: Appropriate and Sharing  Motor: Normal  Speech/Language:  Clear and Coherent and Normal Rate  Affect: Full, appropriate  Mood: Frustrated  Thought process: normal  Thought content:   WNL  Sensory/Perceptual disturbances:   WNL  Orientation: oriented to person, place, time/date, and situation  Attention: Good  Concentration: Good  Memory: WNL  Fund of knowledge:  Good  Insight:   Good  Judgment:  Good  Impulse Control: Good   Risk Assessment: Danger to Self:  No Self-injurious Behavior: No Danger to Others: No Duty to Warn:no Physical Aggression / Violence:No  Access to Firearms a concern: No  Gang Involvement:No   Subjective: Patient reported feeling bad about himself every time he makes a mistake, stating that she believes that he has a disability and should be working on executive function during therapy sessions.  Patient states that he is able to stay focused and organized during work but not at home  because he has more control and autonomy at work.      Interventions: Cognitive Behavioral Therapy, Assertiveness/Communication, Mindfulness Meditation, and Acceptance and commitment Therapy  - Emphasis on this session was accepting himself as valuable regardless of what others say.  When he is able to accept himself as he is (mistakes included) then he can assert that his wife accept him as well.    Diagnosis:Social anxiety disorder  Attention deficit hyperactivity disorder (ADHD), other type  Plan: Regular therapy sessions to continue focusing on increasing awareness, assertiveness, and coping skills in order to lessen avoidance of difficult conversations.   Treatment plan was reviewed with patient/parents.  Patient/parents expressed agreement with the goals, objectives, and treatment methods identified in the treatment plan.    Treatment Plan Client Abilities/Strengths  Intelligence and thoughtful   Client Treatment Preferences  In person sessions   Client Statement of Needs  Patient appears to have some processing deficits consistent with ADHD, and it is possible that he was able to compensate for attention problems in lower grades with high intelligence, but he is suffering more when greater organization and independence was required. 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 Recommendations include participating individual counseling to address these issues.   Problems Addressed  Social Anxiety, Attention Deficit Disorder (ADD) - Adult   Goals 1. Interact socially without undue fear or anxiety. Objective Learn and implement calming and coping strategies to manage anxiety symptoms during moments of social anxiety and lead to  a more relaxed state in general. Target Date: 2022-09-19  Progress: 50   Objective Identify,  challenge, and replace biased, fearful self-talk with reality-based, positive self-talk. Target Date: 2022-12-19  Progress: 50   2. Sustain attention and concentration for consistently longer periods of  time. Objective Learn and implement skills to reduce the disruptive influence of distractibility. Target Date: 2022-09-19  Progress:40   Objective Learn and implement skills to reduce procrastination and avoidance. Target Date: 2022-12-19  Progress: 45   STEVEN ALTABET, PhD                                                               STEVEN ALTABET, PhD 

## 2022-03-26 ENCOUNTER — Ambulatory Visit (INDEPENDENT_AMBULATORY_CARE_PROVIDER_SITE_OTHER): Payer: BC Managed Care – PPO | Admitting: Psychology

## 2022-03-26 ENCOUNTER — Encounter: Payer: Self-pay | Admitting: Psychology

## 2022-03-26 DIAGNOSIS — F4010 Social phobia, unspecified: Secondary | ICD-10-CM | POA: Diagnosis not present

## 2022-03-26 DIAGNOSIS — F908 Attention-deficit hyperactivity disorder, other type: Secondary | ICD-10-CM

## 2022-03-26 NOTE — Progress Notes (Signed)
[facility] Counselor/Therapist Progress Note  Patient ID: Ray Park, MRN: [id number],    Date: 03/26/2022  Time Spent: 4:00 - 5:00 pm   Treatment Type: Individual Therapy  Met with patient for therapy session.  Patient was at the clinic and session was conducted from the therapist's office in person.    Reported Symptoms: Patient appears to have some processing deficits consistent with ADHD, and it is possible that he was  able to compensate for attention problems in lower grades with high intelligence, but he is suffering more when greater organization and independence was required. 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 Recommendations include participating individual counseling to address these issues.   Mental Status Exam: Appearance:  Casually dressed and neatly groomed      Behavior: Appropriate and Sharing  Motor: Normal  Speech/Language:  Clear and Coherent and Normal Rate  Affect: Full, appropriate  Mood: Irritable  Thought process: normal  Thought content:   WNL  Sensory/Perceptual disturbances:   WNL  Orientation: oriented to person, place, time/date, and situation  Attention: Good  Concentration: Good  Memory: WNL  Fund of knowledge:  Good  Insight:   Good  Judgment:  Good  Impulse Control: Good   Risk Assessment: Danger to Self:  No Self-injurious Behavior: No Danger to Others: No Duty to Warn:no Physical Aggression / Violence:No  Access to Firearms a concern: No  Gang Involvement:No   Subjective: Patient reported feeling irritated as his wife has become increasingly controlling and hostile toward him.  He is just beginning to confront her about these issues which is usually met by his wife leaving then returning later without addressing the issue.  Patient is unsure of how to  proceed.       Interventions: Cognitive Behavioral Therapy, Assertiveness/Communication, Mindfulness Meditation, and Acceptance and commitment Therapy  - Emphasis on this session was establishing what he wants his life and marriage to be about and assertively communicating his needs regardless of the expected response.      Diagnosis:Social anxiety disorder  Attention deficit hyperactivity disorder (ADHD), other type  Plan: Regular therapy sessions to continue focusing on increasing awareness, assertiveness, and coping skills in order to lessen avoidance of difficult conversations.   Treatment plan was reviewed with patient/parents.  Patient/parents expressed agreement with the goals, objectives, and treatment methods identified in the treatment plan.    Treatment Plan Client Abilities/Strengths  Intelligence and thoughtful   Client Treatment Preferences  In person sessions   Client Statement of Needs  Patient appears to have some processing deficits consistent with ADHD, and it is possible that he was able to compensate for attention problems in lower grades with high intelligence, but he is suffering more when greater organization and independence was required. 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 Recommendations include participating individual counseling to address these issues.   Problems Addressed  Social Anxiety, Attention Deficit Disorder (ADD) - Adult   Goals 1. Interact socially without undue fear or anxiety. Objective Learn and implement calming and coping strategies to manage anxiety symptoms during moments of social anxiety and lead to a more relaxed state in general. Target Date: 2022-09-19  Progress: 55   Objective Identify, challenge, and  replace biased, fearful self-talk with reality-based, positive  self-talk. Target Date: 2022-12-19  Progress: 55   2. Sustain attention and concentration for consistently longer periods of  time. Objective Learn and implement skills to reduce the disruptive influence of distractibility. Target Date: 2022-09-19  Progress:40   Objective Learn and implement skills to reduce procrastination and avoidance. Target Date: 2022-12-19  Progress: [place], PhD

## 2022-04-09 ENCOUNTER — Ambulatory Visit: Payer: BC Managed Care – PPO | Admitting: Psychology

## 2022-04-23 ENCOUNTER — Ambulatory Visit (INDEPENDENT_AMBULATORY_CARE_PROVIDER_SITE_OTHER): Payer: BC Managed Care – PPO | Admitting: Psychology

## 2022-04-23 ENCOUNTER — Encounter: Payer: Self-pay | Admitting: Psychology

## 2022-04-23 DIAGNOSIS — F4010 Social phobia, unspecified: Secondary | ICD-10-CM | POA: Diagnosis not present

## 2022-04-23 DIAGNOSIS — F908 Attention-deficit hyperactivity disorder, other type: Secondary | ICD-10-CM | POA: Diagnosis not present

## 2022-04-23 NOTE — Progress Notes (Signed)
[facility] Counselor/Therapist Progress Note  Patient ID: Ray Park, MRN: [id number],    Date: 04/23/2022  Time Spent: 4:15 - 5:00 pm   Treatment Type: Individual Therapy  Met with patient for therapy session.  Patient was at the clinic and session was conducted from the therapist's office in person.    Reported Symptoms: Patient appears to have some processing deficits consistent with ADHD, and it is possible that he was  able to compensate for attention problems in lower grades with high intelligence, but he is suffering more when greater organization and independence was required. 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 Recommendations include participating individual counseling to address these issues.   Mental Status Exam: Appearance:  Casually dressed and neatly groomed      Behavior: Appropriate and Sharing  Motor: Normal  Speech/Language:  Clear and Coherent and Normal Rate  Affect: Full, appropriate  Mood: Euthymic  Thought process: normal  Thought content:   WNL  Sensory/Perceptual disturbances:   WNL  Orientation: oriented to person, place, time/date, and situation  Attention: Good  Concentration: Good  Memory: WNL  Fund of knowledge:  Good  Insight:   Good  Judgment:  Good  Impulse Control: Good   Risk Assessment: Danger to Self:  No Self-injurious Behavior: No Danger to Others: No Duty to Warn:no Physical Aggression / Violence:No  Access to Firearms a concern: No  Gang Involvement:No   Subjective: Patient reported minimal change in marital relations, although he is understand that ignoring his wife's histrionic behavior seems to be more effective than confronting.  He indicated being able to assert his needs and do some activities that are important to him despite fears of  negative reprisal from her.  There will be a few events in the future where he has the opportunity to do this more.    Interventions: Cognitive Behavioral Therapy, Assertiveness/Communication, Mindfulness Meditation, and Acceptance and commitment Therapy  - Emphasis on this session was acceptance of not just his emotions and needs but of her's as well, along with planning his communication about what activities are most important to him so he can prepare his wife for these.         Diagnosis:Social anxiety disorder  Attention deficit hyperactivity disorder (ADHD), other type  Plan: Regular therapy sessions to continue focusing on increasing awareness, assertiveness, and coping skills in order to lessen avoidance of difficult conversations.   Treatment plan was reviewed with patient/parents.  Patient/parents expressed agreement with the goals, objectives, and treatment methods identified in the treatment plan.    Treatment Plan Client Abilities/Strengths  Intelligence and thoughtful   Client Treatment Preferences  In person sessions   Client Statement of Needs  Patient appears to have some processing deficits consistent with ADHD, and it is possible that he was able to compensate for attention problems in lower grades with high intelligence, but he is suffering more when greater organization and independence was required. 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 Recommendations include participating individual counseling to address these issues.   Problems Addressed  Social Anxiety, Attention Deficit Disorder (ADD) - Adult   Goals 1. Interact socially without undue fear or anxiety. Objective Learn and implement calming and  coping strategies to manage anxiety symptoms during moments of social anxiety and lead to a more relaxed  state in general. Target Date: 2022-09-19  Progress: 65   Objective Identify, challenge, and replace biased, fearful self-talk with reality-based, positive self-talk. Target Date: 2022-12-19  Progress: 55   2. Sustain attention and concentration for consistently longer periods of  time. Objective Learn and implement skills to reduce the disruptive influence of distractibility. Target Date: 2022-09-19  Progress:50   Objective Learn and implement skills to reduce procrastination and avoidance. Target Date: 2022-12-19  Progress: [place], PhD

## 2022-05-07 ENCOUNTER — Ambulatory Visit (INDEPENDENT_AMBULATORY_CARE_PROVIDER_SITE_OTHER): Payer: BC Managed Care – PPO | Admitting: Psychology

## 2022-05-07 ENCOUNTER — Encounter: Payer: Self-pay | Admitting: Psychology

## 2022-05-07 DIAGNOSIS — F4010 Social phobia, unspecified: Secondary | ICD-10-CM

## 2022-05-07 DIAGNOSIS — F908 Attention-deficit hyperactivity disorder, other type: Secondary | ICD-10-CM | POA: Diagnosis not present

## 2022-05-07 NOTE — Progress Notes (Signed)
[facility] Counselor/Therapist Progress Note  Patient ID: Ray Park, MRN: [id number],    Date: 05/07/2022  Time Spent: 4:00 - 5:00 pm   Treatment Type: Individual Therapy  Met with patient for therapy session.  Patient was at the clinic and session was conducted from the therapist's office in person.    Reported Symptoms: Patient appears to have some processing deficits consistent with ADHD, and it is possible that he was  able to compensate for attention problems in lower grades with high intelligence, but he is suffering more when greater organization and independence was required. 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 Recommendations include participating individual counseling to address these issues.   Mental Status Exam: Appearance:  Casually dressed and neatly groomed      Behavior: Appropriate and Sharing  Motor: Normal  Speech/Language:  Clear and Coherent and Normal Rate  Affect: Full, appropriate  Mood: Frustrated  Thought process: normal  Thought content:   WNL  Sensory/Perceptual disturbances:   WNL  Orientation: oriented to person, place, time/date, and situation  Attention: Good  Concentration: Good  Memory: WNL  Fund of knowledge:  Good  Insight:   Good  Judgment:  Good  Impulse Control: Good   Risk Assessment: Danger to Self:  No Self-injurious Behavior: No Danger to Others: No Duty to Warn:no Physical Aggression / Violence:No  Access to Firearms a concern: No  Gang Involvement:No   Subjective: Patient reported minimal change in marital relations overall, although he noted a positive development in being able to see his friends at a larger social gathering and was able to assert himself to his wife about not wanting to wear a mask and she agreed.  Conversely, she and her  mother continue to insist that patient has a problem because of his mild forgetfulness and executive function deficits.  He also mentioned how his wife berates him, sometimes in public when he does not do activity how she expects.  She stated that he will need to go on medication if he does not improve.  He wishes to retake neurocognitive testing to confirm that his deficits, if any, are minimal.     Interventions: Cognitive Behavioral Therapy, Assertiveness/Communication, Mindfulness Meditation, and Acceptance and commitment Therapy  - Emphasis on asserting his boundaries (how he wants her to treat him) with consequences for them not being respected        Diagnosis:Social anxiety disorder  Attention deficit hyperactivity disorder (ADHD), other type  Plan: Regular therapy sessions to continue focusing on increasing awareness, assertiveness, and coping skills in order to lessen avoidance of difficult conversations. The CNS Vital Signs test will be re-administered next session per patient request  Treatment plan was reviewed with patient/parents.  Patient/parents expressed agreement with the goals, objectives, and treatment methods identified in the treatment plan.    Treatment Plan Client Abilities/Strengths  Intelligence and thoughtful   Client Treatment Preferences  In person sessions   Client Statement of Needs  Patient appears to have some processing deficits consistent with ADHD, and it is possible that he was able to compensate for attention problems in lower grades with high intelligence, but he is suffering more when greater organization and independence was required. 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 Recommendations include participating individual counseling to address these issues.   Problems Addressed  Social  Anxiety, Attention Deficit Disorder (ADD) - Adult   Goals 1. Interact socially without undue fear or anxiety. Objective Learn and implement calming and coping strategies to manage anxiety symptoms during moments of social anxiety and lead to a more relaxed state in general. Target Date: 2022-09-19  Progress: 70   Objective Identify, challenge, and replace biased, fearful self-talk with reality-based, positive self-talk. Target Date: 2022-12-19  Progress: 60   2. Sustain attention and concentration for consistently longer periods of  time. Objective Learn and implement skills to reduce the disruptive influence of distractibility. Target Date: 2022-09-19  Progress:55   Objective Learn and implement skills to reduce procrastination and avoidance. Target Date: 2022-12-19  Progress: 60   [name], PhD                                                                                                      [name], PhD

## 2022-05-15 ENCOUNTER — Encounter: Payer: Self-pay | Admitting: Family

## 2022-05-15 ENCOUNTER — Telehealth (INDEPENDENT_AMBULATORY_CARE_PROVIDER_SITE_OTHER): Payer: BC Managed Care – PPO | Admitting: Family

## 2022-05-15 VITALS — Ht 75.25 in | Wt 195.0 lb

## 2022-05-15 DIAGNOSIS — U071 COVID-19: Secondary | ICD-10-CM | POA: Insufficient documentation

## 2022-05-15 MED ORDER — NIRMATRELVIR/RITONAVIR (PAXLOVID)TABLET: 3.0000 | ORAL_TABLET | Freq: Two times a day (BID) | ORAL | 0 refills | Status: AC

## 2022-05-15 NOTE — Progress Notes (Signed)
MyChart Video Visit    Virtual Visit via Video Note   This visit type was conducted due to national recommendations for restrictions regarding the COVID-19 Pandemic (e.g. social distancing) in an effort to limit this patient's exposure and mitigate transmission in our community. This patient is at least at moderate risk for complications without adequate follow up. This format is felt to be most appropriate for this patient at this time. Physical exam was limited by quality of the video and audio technology used for the visit. CMA was able to get the patient set up on a video visit.  Patient location: Home. Patient and provider in visit Provider location: Office  I discussed the limitations of evaluation and management by telemedicine and the availability of in person appointments. The patient expressed understanding and agreed to proceed.  Visit Date: 05/15/2022  Today's healthcare provider: Mort Sawyers, FNP     Subjective:    Patient ID: Ray Park, male    DOB: 10-29-1970, 51 y.o.   MRN: [id number]  Chief Complaint  Patient presents with   Covid Positive    HPI  Pt here today via video visit with concerns.   Covid tested this am and tested positive this am.  Started with symptoms this am.  Coworkers with covid earlier in the week.  With sore throat, pnd, nasal congestion slight, and possible fever. With some chills. Thinks fever last night feels ok this am.   With slight non productive cough.  First time having covid.    Past Medical History:  Diagnosis Date   Pure hypercholesterolemia    Pure hyperglyceridemia     Past Surgical History:  Procedure Laterality Date   ACL replacement  2001   US ECHOCARDIOGRAPHY     No MVP; benign murmur    Family History  Problem Relation Age of Onset   Hypertension Father    Diabetes Father    Alzheimer's disease Mother        Early onset (39's)   Alzheimer's disease Maternal Grandfather    Alzheimer's disease  Maternal Aunt    Cancer Sister 18       uterine cancer   Cancer Maternal Grandmother 28       uterine    Social History   Socioeconomic History   Marital status: Married    Spouse name: Not on file   Number of children: Not on file   Years of education: Not on file   Highest education level: Not on file  Occupational History   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  Tobacco Use   Smoking status: Never   Smokeless tobacco: Never  Substance and Sexual Activity   Alcohol use: Yes    Comment: Rare   Drug use: No   Sexual activity: Not on file  Other Topics Concern   Not on file  Social History Narrative   No regular exercise      Nature conservation officer at vinyl s[employer]      Single; girlfriend; sexually active      Fruits, veggies, leans, limited fast food            Social Determinants of Health   Financial Resource Strain: Not on file  Food Insecurity: Not on file  Transportation Needs: Not on file  Physical Activity: Not on file  Stress: Not on file  Social Connections: Not on file  Intimate Partner Violence: Not on file    Outpatient Medications Prior to Visit  Medication Sig Dispense Refill  COVID-19 mRNA bivalent vaccine, Moderna, (MODERNA COVID-19 BIVAL BOOSTER) 50 MCG/0.5ML injection Inject into the muscle. 0.5 mL 0   No facility-administered medications prior to visit.    No Known Allergies  Review of Systems     Objective:    Physical Exam Constitutional:      General: He is not in acute distress.    Appearance: Normal appearance. He is normal weight. He is not ill-appearing, toxic-appearing or diaphoretic.  Pulmonary:     Effort: Pulmonary effort is normal.  Neurological:     General: No focal deficit present.     Mental Status: He is alert and oriented to person, place, and time. Mental status is at baseline.  Psychiatric:        Mood and Affect: Mood normal.        Behavior: Behavior normal.        Thought Content: Thought  content normal.        Judgment: Judgment normal.     Ht 6' 3.25" (1.911 m)   Wt 195 lb (88.5 kg)   BMI 24.21 kg/m  Wt Readings from Last 3 Encounters:  05/15/22 195 lb (88.5 kg)  07/10/21 195 lb 7 oz (88.6 kg)  07/09/20 195 lb 12 oz (88.8 kg)       Assessment & Plan:   Problem List Items Addressed This Visit       Other   COVID-19 - Primary    Advised of CDC guidelines for self isolation/ ending isolation.  Advised of safe practice guidelines. Symptom Tier reviewed.  Encouraged to monitor for any worsening symptoms; watch for increased shortness of breath, weakness, and signs of dehydration. Advised when to seek emergency care.  Instructed to rest and hydrate well.  Advised to leave the house during recommended isolation period, only if it is necessary to seek medical care  I  have decided pt is a candidate for antiviral and pt agrees that he would like to take this. I have sent in RX for paxlovid to be taken as directed.       Relevant Medications   nirmatrelvir/ritonavir EUA (PAXLOVID) 20 x 150 MG & 10 x 100MG  TABS    I have discontinued "Jamie"'s Moderna COVID-19 Bival Booster. I am also having him start on nirmatrelvir/ritonavir EUA.  Meds ordered this encounter  Medications   nirmatrelvir/ritonavir EUA (PAXLOVID) 20 x 150 MG & 10 x 100MG  TABS    Sig: Take 3 tablets by mouth 2 (two) times daily for 5 days. (Take nirmatrelvir 150 mg two tablets twice daily for 5 days and ritonavir 100 mg one tablet twice daily for 5 days) Patient GFR is 71.10    Dispense:  30 tablet    Refill:  0    Order Specific Question:   Supervising Provider    Answer:   BEDSOLE, AMY E [[id number]]    I discussed the assessment and treatment plan with the patient. The patient was provided an opportunity to ask questions and all were answered. The patient agreed with the plan and demonstrated an understanding of the instructions.   The patient was advised to call back or seek an  in-person evaluation if the symptoms worsen or if the condition fails to improve as anticipated.  I provided 15 minutes of face-to-face time during this encounter.   Ray Ginger, FNP [facility] [phone] (phone) [phone] (fax)  [facility]

## 2022-05-15 NOTE — Assessment & Plan Note (Addendum)
Advised of CDC guidelines for self isolation/ ending isolation.  Advised of safe practice guidelines. Symptom Tier reviewed.  Encouraged to monitor for any worsening symptoms; watch for increased shortness of breath, weakness, and signs of dehydration. Advised when to seek emergency care.  Instructed to rest and hydrate well.  Advised to leave the house during recommended isolation period, only if it is necessary to seek medical care  I  have decided pt is a candidate for antiviral and pt agrees that he would like to take this. I have sent in RX for paxlovid to be taken as directed.

## 2022-05-21 ENCOUNTER — Ambulatory Visit: Payer: BC Managed Care – PPO | Admitting: Psychology

## 2022-05-25 ENCOUNTER — Telehealth: Payer: Self-pay | Admitting: Family Medicine

## 2022-05-25 NOTE — Telephone Encounter (Signed)
Spoke to patient by telephone and was advised that he was feeling better after finishing the Paxlovid. Patient stated that he had a set back over the weekend. Patient stated that he has a cough productive at times, sinus congestion and a fever. Patient stated that he is wondering what would be recommended since he has had a set back. Pharmacy [facility]

## 2022-05-25 NOTE — Telephone Encounter (Signed)
Patient called in stating that he was seen by Mort Sawyers on 05/15/22 for Covid. He was giving Paxlovid to take for Covid. Patient stated that he was feeling well but has been experiencing Covid symptoms again. Wants to some advice on what to do now. He wasn't sure if a second dose of Paxlovid will be fine. He can be reached at [phone]. Please advise. Thank you!

## 2022-05-26 ENCOUNTER — Ambulatory Visit: Payer: BC Managed Care – PPO | Admitting: Family Medicine

## 2022-05-26 VITALS — BP 110/70 | HR 76 | Temp 99.5°F | Ht 75.25 in | Wt 193.0 lb

## 2022-05-26 DIAGNOSIS — R051 Acute cough: Secondary | ICD-10-CM | POA: Diagnosis not present

## 2022-05-26 DIAGNOSIS — U071 COVID-19: Secondary | ICD-10-CM | POA: Diagnosis not present

## 2022-05-26 MED ORDER — AZITHROMYCIN 250 MG PO TABS: ORAL_TABLET | ORAL | 0 refills | Status: AC

## 2022-05-26 MED ORDER — BENZONATATE 200 MG PO CAPS: 200.0000 mg | ORAL_CAPSULE | Freq: Two times a day (BID) | ORAL | 0 refills | Status: DC | PRN

## 2022-05-26 NOTE — Telephone Encounter (Signed)
Please have pt seen in office by Dr. Ermalene Searing. This was on 8/4. We would have to assess to see if bacterial infection.

## 2022-05-26 NOTE — Progress Notes (Signed)
Patient ID: Ray Park, male    DOB: 07-May-1971, 51 y.o.   MRN: [id number]  This visit was conducted in person.  BP 110/70   Pulse 76   Temp 99.5 F (37.5 C)   Ht 6' 3.25" (1.911 m)   Wt 193 lb (87.5 kg)   SpO2 97%   BMI 23.96 kg/m    CC:  Chief Complaint  Patient presents with   Follow-up    Follow up from covid , still having coughing , still testing positive, no fever  ,diaherra     Subjective:   HPI: Ray Park is a 51 y.o. male presenting on 05/26/2022 for Follow-up (Follow up from covid , still having coughing , still testing positive, no fever  ,diaherra )   Date of Onset: 05/15/2022  Symptoms started with ST, PND, nasal congestion, low grade fever, chills Reviewed virtual OV from 05/13/2022 Treated with paxlovid. He was able to complte this   Today he reports he felt much better.. fever broke, symptoms almost all the way resolve.d  Went back to work last week.  4 days ago congestion restarted, cough productive ( clear yellow), fever low grade 100.90F. Able to sleep without coughing. No ST.  No SOB, chest pain, no wheeze.  Diarrhea started yesterday.  Using Mucinex, has not helped with cough much.      Relevant past medical, surgical, family and social history reviewed and updated as indicated. Interim medical history since our last visit reviewed. Allergies and medications reviewed and updated. No outpatient medications prior to visit.   No facility-administered medications prior to visit.     Per HPI unless specifically indicated in ROS section below Review of Systems  Constitutional:  Positive for fatigue. Negative for fever.  HENT:  Negative for ear pain.   Eyes:  Negative for pain.  Respiratory:  Positive for cough. Negative for shortness of breath.   Cardiovascular:  Negative for chest pain, palpitations and leg swelling.  Gastrointestinal:  Negative for abdominal pain.  Genitourinary:  Negative for dysuria.  Musculoskeletal:  Negative for  arthralgias.  Neurological:  Negative for syncope, light-headedness and headaches.  Psychiatric/Behavioral:  Negative for dysphoric mood.    Objective:  BP 110/70   Pulse 76   Temp 99.5 F (37.5 C)   Ht 6' 3.25" (1.911 m)   Wt 193 lb (87.5 kg)   SpO2 97%   BMI 23.96 kg/m   Wt Readings from Last 3 Encounters:  05/26/22 193 lb (87.5 kg)  05/15/22 195 lb (88.5 kg)  07/10/21 195 lb 7 oz (88.6 kg)      Physical Exam Constitutional:      General: He is not in acute distress.    Appearance: Normal appearance. He is well-developed. He is not ill-appearing or toxic-appearing.  HENT:     Head: Normocephalic and atraumatic.     Right Ear: Hearing, tympanic membrane, ear canal and external ear normal. No tenderness. No foreign body. Tympanic membrane is not retracted or bulging.     Left Ear: Hearing, tympanic membrane, ear canal and external ear normal. No tenderness. No foreign body. Tympanic membrane is not retracted or bulging.     Nose: Nose normal. No mucosal edema or rhinorrhea.     Right Sinus: No maxillary sinus tenderness or frontal sinus tenderness.     Left Sinus: No maxillary sinus tenderness or frontal sinus tenderness.     Mouth/Throat:     Dentition: Normal dentition. No dental caries.  Pharynx: Uvula midline. No oropharyngeal exudate.     Tonsils: No tonsillar abscesses.  Eyes:     General: Lids are normal. Lids are everted, no foreign bodies appreciated.     Conjunctiva/sclera: Conjunctivae normal.     Pupils: Pupils are equal, round, and reactive to light.  Neck:     Thyroid: No thyroid mass or thyromegaly.     Vascular: No carotid bruit.     Trachea: Trachea and phonation normal.  Cardiovascular:     Rate and Rhythm: Normal rate and regular rhythm.     Pulses: Normal pulses.     Heart sounds: Normal heart sounds, S1 normal and S2 normal. Heart sounds not distant. No murmur heard.    No friction rub. No gallop.     Comments: No peripheral edema Pulmonary:      Effort: Pulmonary effort is normal. No respiratory distress.     Breath sounds: Normal breath sounds. No wheezing, rhonchi or rales.  Abdominal:     General: Bowel sounds are normal.     Palpations: Abdomen is soft.     Tenderness: There is no abdominal tenderness. There is no guarding or rebound.     Hernia: No hernia is present.  Musculoskeletal:     Cervical back: Normal range of motion and neck supple.  Skin:    General: Skin is warm and dry.     Findings: No rash.  Neurological:     Mental Status: He is alert.     Deep Tendon Reflexes: Reflexes are normal and symmetric.  Psychiatric:        Speech: Speech normal.        Behavior: Behavior normal.        Thought Content: Thought content normal.        Judgment: Judgment normal.       Results for orders placed or performed in visit on 07/03/21  PSA  Result Value Ref Range   PSA 0.86 0.10 - 4.00 ng/mL  Comprehensive metabolic panel  Result Value Ref Range   Sodium 141 135 - 145 mEq/L   Potassium 4.0 3.5 - 5.1 mEq/L   Chloride 102 96 - 112 mEq/L   CO2 32 19 - 32 mEq/L   Glucose, Bld 89 70 - 99 mg/dL   BUN 17 6 - 23 mg/dL   Creatinine, Ser 5.27 0.40 - 1.50 mg/dL   Total Bilirubin 0.6 0.2 - 1.2 mg/dL   Alkaline Phosphatase 65 39 - 117 U/L   AST 23 0 - 37 U/L   ALT 23 0 - 53 U/L   Total Protein 7.2 6.0 - 8.3 g/dL   Albumin 4.7 3.5 - 5.2 g/dL   GFR 78.24 >23.53 mL/min   Calcium 9.8 8.4 - 10.5 mg/dL  Lipid panel  Result Value Ref Range   Cholesterol 182 0 - 200 mg/dL   Triglycerides 614.4 (H) 0.0 - 149.0 mg/dL   HDL 31.54 (L) >00.86 mg/dL   VLDL 76.1 0.0 - 95.0 mg/dL   LDL Cholesterol 932 (H) 0 - 99 mg/dL   Total CHOL/HDL Ratio 5    NonHDL 145.64      COVID 19 screen:  No recent travel or known exposure to COVID19 The patient denies respiratory symptoms of COVID 19 at this time. The importance of social distancing was discussed today.   Assessment and Plan Problem List Items Addressed This Visit     Acute  cough   COVID-19 - Primary   Relevant Medications   azithromycin (  ZITHROMAX) 250 MG tablet    Rest, fluids.  Complete antibiotics.  Can use benzonatate  as needed for cough.  Return to work 24 hours after on antibiotics or fever resolved.    Kerby Nora, MD

## 2022-05-26 NOTE — Patient Instructions (Signed)
Rest, fluids.  Complete antibiotics.  Can use benzonatate  as needed for cough.  Return to work 24 hours after on antibiotics or fever resolved.

## 2022-05-26 NOTE — Telephone Encounter (Signed)
Pt was scheduled for an appt, 05/26/22.

## 2022-06-04 ENCOUNTER — Ambulatory Visit (INDEPENDENT_AMBULATORY_CARE_PROVIDER_SITE_OTHER): Payer: BC Managed Care – PPO | Admitting: Psychology

## 2022-06-04 ENCOUNTER — Encounter: Payer: Self-pay | Admitting: Psychology

## 2022-06-04 DIAGNOSIS — F908 Attention-deficit hyperactivity disorder, other type: Secondary | ICD-10-CM

## 2022-06-04 DIAGNOSIS — F4010 Social phobia, unspecified: Secondary | ICD-10-CM

## 2022-06-04 NOTE — Progress Notes (Signed)
[place] Counselor/Therapist Progress Note  Patient ID: Ray Park, MRN: [id number],    Date: 06/04/2022  Time Spent: 4:00 - 5:00 pm   Treatment Type: Individual Therapy  Met with patient for therapy session.  Patient was at the clinic and session was conducted from the therapist's office in person.    Reported Symptoms: Patient appears to have some processing deficits consistent with ADHD, and it is possible that he was  able to compensate for attention problems in lower grades with high intelligence, but he is suffering more when greater organization and independence was required. 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 Recommendations include participating individual counseling to address these issues.   Mental Status Exam: Appearance:  Casually dressed and neatly groomed      Behavior: Appropriate and Sharing  Motor: Normal  Speech/Language:  Clear and Coherent and Normal Rate  Affect: Full, appropriate  Mood: Euthymic - relaxed  Thought process: normal  Thought content:   WNL  Sensory/Perceptual disturbances:   WNL  Orientation: oriented to person, place, time/date, and situation  Attention: Good  Concentration: Good  Memory: WNL  Fund of knowledge:  Good  Insight:   Good  Judgment:  Good  Impulse Control: Good   Risk Assessment: Danger to Self:  No Self-injurious Behavior: No Danger to Others: No Duty to Warn:no Physical Aggression / Violence:No  Access to Firearms a concern: No  Gang Involvement:No   Subjective: Patient wished to be re-evaluated regarding the mild forgetfulness and executive function deficits, as he contended that they are 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  Computerized Testing was redone indicating mild deficits in executive  function, cognitive flexibility, and processing speed with much hesitancy n responding.  This was slightly higher than the previous testing but still suggested difficulty processing when others given him too much detail too quickly.       Interventions: Cognitive Behavioral Therapy, Assertiveness/Communication, Mindfulness Meditation, and Acceptance and commitment Therapy  - Emphasis on communicating his processing needs with others at home and work, so he can understand and follow instructions more effectively.  Medication to improve attention was also discussed.     Diagnosis:Attention deficit hyperactivity disorder (ADHD), other type  Social anxiety disorder  Plan: Regular therapy sessions to continue focusing on increasing awareness, assertiveness, and coping skills in order to lessen avoidance of difficult conversations.   Treatment plan was reviewed with patient/parents.  Patient/parents expressed agreement with the goals, objectives, and treatment methods identified in the treatment plan.    Treatment Plan Client Abilities/Strengths  Intelligence and thoughtful   Client Treatment Preferences  In person sessions   Client Statement of Needs  Patient appears to have some processing deficits consistent with ADHD, and it is possible that he was able to compensate for attention problems in lower grades with high intelligence, but he is suffering more when greater organization and independence was required. 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 Recommendations include participating individual counseling to address these issues.   Problems Addressed  Social Anxiety, Attention Deficit Disorder (ADD) - Adult   Goals 1. Interact socially without undue fear or anxiety. Objective Learn and implement calming and coping  strategies  to manage anxiety symptoms during moments of social anxiety and lead to a more relaxed state in general. Target Date: 2022-09-19  Progress: 70   Objective Identify, challenge, and replace biased, fearful self-talk with reality-based, positive self-talk. Target Date: 2022-12-19  Progress: 60   2. Sustain attention and concentration for consistently longer periods of  time. Objective Learn and implement skills to reduce the disruptive influence of distractibility. Target Date: 2022-09-19  Progress:65   Objective Learn and implement skills to reduce procrastination and avoidance. Target Date: 2022-12-19  Progress: 70   STEVEN ALTABET, PhD                                                                                                      [name], PhD               [name], PhD

## 2022-06-10 ENCOUNTER — Other Ambulatory Visit: Payer: BC Managed Care – PPO

## 2022-06-18 ENCOUNTER — Encounter: Payer: Self-pay | Admitting: Psychology

## 2022-06-18 ENCOUNTER — Ambulatory Visit (INDEPENDENT_AMBULATORY_CARE_PROVIDER_SITE_OTHER): Payer: BC Managed Care – PPO | Admitting: Psychology

## 2022-06-18 DIAGNOSIS — F4010 Social phobia, unspecified: Secondary | ICD-10-CM

## 2022-06-18 DIAGNOSIS — F908 Attention-deficit hyperactivity disorder, other type: Secondary | ICD-10-CM | POA: Diagnosis not present

## 2022-06-18 NOTE — Progress Notes (Signed)
[facility] Counselor/Therapist Progress Note  Patient ID: Ray Park, MRN: [id number],    Date: 06/18/2022  Time Spent: 2:00 - 2:50 pm   Treatment Type: Individual Therapy  Met with patient for therapy session.  Patient was at the clinic and session was conducted from the therapist's office in person.    Reported Symptoms: Patient appears to have some processing deficits consistent with ADHD, and it is possible that he was  able to compensate for attention problems in lower grades with high intelligence, but he is suffering more when greater organization and independence was required. 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 Recommendations include participating individual counseling to address these issues.   Mental Status Exam: Appearance:  Casually dressed and neatly groomed      Behavior: Appropriate and Sharing  Motor: Normal  Speech/Language:  Clear and Coherent and Normal Rate  Affect: Full, appropriate  Mood: Frustrated  Thought process: normal  Thought content:   WNL  Sensory/Perceptual disturbances:   WNL  Orientation: oriented to person, place, time/date, and situation  Attention: Good  Concentration: Good  Memory: WNL  Fund of knowledge:  Good  Insight:   Good  Judgment:  Good  Impulse Control: Good   Risk Assessment: Danger to Self:  No Self-injurious Behavior: No Danger to Others: No Duty to Warn:no Physical Aggression / Violence:No  Access to Firearms a concern: No  Gang Involvement:No   Subjective: Patient stated that he is working on accepting his mild deficits in executive function, cognitive flexibility, and processing speed.  He is able to function well at work, where is job responsibilities are tailored to his strengths, but he struggles more at home where his wife exerts  much control over how household activities are to be done.  This, combined with patient's hesitancy to discuss his needs with her, for fear of an intense negative reaction, helps maintain a lack of reciprocity at home.  Patient still values his relationship despite these issues as he indicated than there are many positive aspects of their marriage as well.  He did not report any new concerns.         Interventions: Cognitive Behavioral Therapy, Assertiveness/Communication, Mindfulness Meditation, and Acceptance and commitment Therapy  - Emphasis on communicating his processing needs with others at home and work, doing so in an assertive way that is not overly blunt but gets the point across.  Also discussed was how repetition and practice can lead to familiarity and compensate for processing deficits.    Diagnosis:Social anxiety disorder  Attention deficit hyperactivity disorder (ADHD), other type  Plan: Regular therapy sessions to continue focusing on increasing awareness, assertiveness, and coping skills in order to lessen avoidance of difficult conversations. Wife may attend a session for a separate consult regarding patient.  Patient consented to this verbally.  Treatment plan was reviewed with patient/parents.  Patient/parents expressed agreement with the goals, objectives, and treatment methods identified in the treatment plan.    Treatment Plan Client Abilities/Strengths  Intelligence and thoughtful   Client Treatment Preferences  In person sessions   Client Statement of Needs  Patient appears to have some processing deficits consistent with ADHD, and it is possible that he was able to compensate for attention problems in lower grades with high intelligence, but he is suffering more when greater organization and independence was required. 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 Recommendations include participating individual counseling to address these issues.   Problems Addressed  Social Anxiety, Attention Deficit Disorder (ADD) - Adult   Goals 1. Interact socially without undue fear or anxiety. Objective Learn and implement calming and coping strategies to manage anxiety symptoms during moments of social anxiety and lead to a more relaxed state in general. Target Date: 2022-09-19  Progress: 75   Objective Identify, challenge, and replace biased, fearful self-talk with reality-based, positive self-talk. Target Date: 2022-12-19  Progress: 65   2. Sustain attention and concentration for consistently longer periods of  time. Objective Learn and implement skills to reduce the disruptive influence of distractibility. Target Date: 2022-09-19  Progress:70  Objective Learn and implement skills to reduce procrastination and avoidance. Target Date: 2022-12-19  Progress: 75   Ray Deskin, PhD                                                                                                      [name], PhD               [name], PhD

## 2022-06-29 ENCOUNTER — Telehealth: Payer: Self-pay | Admitting: Family Medicine

## 2022-06-29 DIAGNOSIS — Z1159 Encounter for screening for other viral diseases: Secondary | ICD-10-CM

## 2022-06-29 DIAGNOSIS — Z125 Encounter for screening for malignant neoplasm of prostate: Secondary | ICD-10-CM

## 2022-06-29 DIAGNOSIS — Z13220 Encounter for screening for lipoid disorders: Secondary | ICD-10-CM

## 2022-06-29 NOTE — Telephone Encounter (Signed)
-----   Message from Ellamae Sia sent at 06/23/2022  4:02 PM EDT ----- Regarding: Lab orders for Wednesday, 9.27.23 Patient is scheduled for CPX labs, please order future labs, Thanks , Karna Christmas

## 2022-06-30 ENCOUNTER — Encounter: Payer: Self-pay | Admitting: Family Medicine

## 2022-06-30 DIAGNOSIS — Z1159 Encounter for screening for other viral diseases: Secondary | ICD-10-CM

## 2022-06-30 DIAGNOSIS — Z8349 Family history of other endocrine, nutritional and metabolic diseases: Secondary | ICD-10-CM

## 2022-06-30 DIAGNOSIS — Z13220 Encounter for screening for lipoid disorders: Secondary | ICD-10-CM

## 2022-06-30 DIAGNOSIS — Z125 Encounter for screening for malignant neoplasm of prostate: Secondary | ICD-10-CM

## 2022-07-01 NOTE — Telephone Encounter (Signed)
I just wanted to make sure he also that patient has requested thyroid labs to be added to the labs diet already entered.  He is coming in for lab work later in September.

## 2022-07-02 ENCOUNTER — Encounter: Payer: Self-pay | Admitting: Psychology

## 2022-07-02 ENCOUNTER — Ambulatory Visit: Payer: BC Managed Care – PPO | Admitting: Psychology

## 2022-07-02 DIAGNOSIS — F4010 Social phobia, unspecified: Secondary | ICD-10-CM | POA: Diagnosis not present

## 2022-07-02 DIAGNOSIS — F908 Attention-deficit hyperactivity disorder, other type: Secondary | ICD-10-CM

## 2022-07-02 NOTE — Progress Notes (Signed)
[facility] Counselor/Therapist Progress Note  Patient ID: Ray Park, MRN: [id number],    Date: 07/02/2022  Time Spent: 4:00 - 5:00 pm   Treatment Type: Individual Therapy  Met with patient for therapy session.  Patient was at the clinic and session was conducted from the therapist's office in person.    Reported Symptoms: Patient appears to have some processing deficits consistent with ADHD, and it is possible that he was  able to compensate for attention problems in lower grades with high intelligence, but he is suffering more when greater organization and independence was required. 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 Recommendations include participating individual counseling to address these issues.   Mental Status Exam: Appearance:  Casually dressed and neatly groomed      Behavior: Appropriate and Sharing  Motor: Normal  Speech/Language:  Clear and Coherent and Normal Rate  Affect: Full, appropriate  Mood: Anxious  Thought process: normal  Thought content:   WNL  Sensory/Perceptual disturbances:   WNL  Orientation: oriented to person, place, time/date, and situation  Attention: Good  Concentration: Good  Memory: WNL  Fund of knowledge:  Good  Insight:   Good  Judgment:  Good  Impulse Control: Good   Risk Assessment: Danger to Self:  No Self-injurious Behavior: No Danger to Others: No Duty to Warn:no Physical Aggression / Violence:No  Access to Firearms a concern: No  Gang Involvement:No   Subjective: Patient stated that he is working on Psychologist, counselling strategies that are successful for him at work into home life such as daily task and checklists.  He worries, however, that no organizational strategy he employs will be enough to satisfy his wife's strict attention to detail  or allay her fears about his forgetfulness.  He indicated having a long history of not asserting his feelings to avoid confrontation/negative reactions since childhood, but most he is anxious about his wife's demands and putting blame on the patient will only increase, eventually forcing him to leave the marriage.               Interventions: Cognitive Behavioral Therapy, Assertiveness/Communication, Mindfulness Meditation, and Acceptance and commitment Therapy  - Emphasis on acceptance of his wife as she is, with the idea that boundaries need to be set, even thought they are unpleasant, in order to ensure that boundaries are not further breached in the future.  Also discussed was communicating about her accepting him as he is, while he is trying to improve his organization at home.     Diagnosis:Social anxiety disorder  Attention deficit hyperactivity disorder (ADHD), other type  Plan: Regular therapy sessions to continue focusing on increasing awareness, assertiveness, and coping skills in order to lessen avoidance of difficult conversations. Wife may attend a session for a separate consult regarding patient.  Patient consented to this verbally.  Treatment plan was reviewed with patient/parents.  Patient/parents expressed agreement with the goals, objectives, and treatment methods identified in the treatment plan.    Treatment Plan Client Abilities/Strengths  Intelligence and thoughtful   Client Treatment Preferences  In person sessions   Client Statement of Needs  Patient appears to have some processing deficits consistent with ADHD, and it is possible that he was able to compensate for attention problems in lower grades with high intelligence, but he is suffering more when greater organization and independence was  required. 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 Recommendations include participating individual counseling to address these issues.   Problems Addressed  Social Anxiety, Attention Deficit Disorder (ADD) - Adult   Goals 1. Interact socially without undue fear or anxiety. Objective Learn and implement calming and coping strategies to manage anxiety symptoms during moments of social anxiety and lead to a more relaxed state in general. Target Date: 2022-09-19  Progress: 80   Objective Identify, challenge, and replace biased, fearful self-talk with reality-based, positive self-talk. Target Date: 2022-12-19  Progress: 70   2. Sustain attention and concentration for consistently longer periods of  time. Objective Learn and implement skills to reduce the disruptive influence of distractibility. Target Date: 2022-09-19  Progress:75  Objective Learn and implement skills to reduce procrastination and avoidance. Target Date: 2022-12-19  Progress: 80   [name], PhD                                                                                                          [name], PhD

## 2022-07-08 ENCOUNTER — Other Ambulatory Visit (INDEPENDENT_AMBULATORY_CARE_PROVIDER_SITE_OTHER): Payer: BC Managed Care – PPO

## 2022-07-08 DIAGNOSIS — Z125 Encounter for screening for malignant neoplasm of prostate: Secondary | ICD-10-CM

## 2022-07-08 DIAGNOSIS — Z13220 Encounter for screening for lipoid disorders: Secondary | ICD-10-CM | POA: Diagnosis not present

## 2022-07-08 DIAGNOSIS — Z8349 Family history of other endocrine, nutritional and metabolic diseases: Secondary | ICD-10-CM | POA: Diagnosis not present

## 2022-07-08 DIAGNOSIS — Z1159 Encounter for screening for other viral diseases: Secondary | ICD-10-CM

## 2022-07-08 LAB — COMPREHENSIVE METABOLIC PANEL
ALT: 25 U/L (ref 0–53)
AST: 21 U/L (ref 0–37)
Albumin: 4.5 g/dL (ref 3.5–5.2)
Alkaline Phosphatase: 65 U/L (ref 39–117)
BUN: 18 mg/dL (ref 6–23)
CO2: 30 mEq/L (ref 19–32)
Calcium: 9.7 mg/dL (ref 8.4–10.5)
Chloride: 104 mEq/L (ref 96–112)
Creatinine, Ser: 1.18 mg/dL (ref 0.40–1.50)
GFR: 70.6 mL/min (ref >60.00)
Glucose, Bld: 95 mg/dL (ref 70–99)
Potassium: 4.1 mEq/L (ref 3.5–5.1)
Sodium: 140 mEq/L (ref 135–145)
Total Bilirubin: 0.6 mg/dL (ref 0.2–1.2)
Total Protein: 6.9 g/dL (ref 6.0–8.3)

## 2022-07-08 LAB — LIPID PANEL
Cholesterol: 167 mg/dL (ref 0–200)
HDL: 39 mg/dL — ABNORMAL LOW (ref >39.00)
LDL Cholesterol: 99 mg/dL (ref 0–99)
NonHDL: 127.7
Total CHOL/HDL Ratio: 4
Triglycerides: 142 mg/dL (ref 0.0–149.0)
VLDL: 28.4 mg/dL (ref 0.0–40.0)

## 2022-07-08 LAB — T3, FREE: T3, Free: 3 pg/mL (ref 2.3–4.2)

## 2022-07-08 LAB — TSH: TSH: 3.08 u[IU]/mL (ref 0.35–5.50)

## 2022-07-08 LAB — T4, FREE: Free T4: 0.69 ng/dL (ref 0.60–1.60)

## 2022-07-08 LAB — PSA: PSA: 0.92 ng/mL (ref 0.10–4.00)

## 2022-07-08 NOTE — Progress Notes (Signed)
No critical labs need to be addressed urgently. We will discuss labs in detail at upcoming office visit.   

## 2022-07-09 LAB — HEPATITIS C ANTIBODY: Hepatitis C Ab: NONREACTIVE

## 2022-07-09 NOTE — Progress Notes (Signed)
No critical labs need to be addressed urgently. We will discuss labs in detail at upcoming office visit.   

## 2022-07-14 ENCOUNTER — Encounter: Payer: Self-pay | Admitting: *Deleted

## 2022-07-14 ENCOUNTER — Ambulatory Visit (INDEPENDENT_AMBULATORY_CARE_PROVIDER_SITE_OTHER): Payer: BC Managed Care – PPO | Admitting: Family Medicine

## 2022-07-14 ENCOUNTER — Encounter: Payer: Self-pay | Admitting: Family Medicine

## 2022-07-14 VITALS — BP 92/62 | HR 53 | Temp 98.6°F | Ht 75.5 in | Wt 196.0 lb

## 2022-07-14 DIAGNOSIS — L989 Disorder of the skin and subcutaneous tissue, unspecified: Secondary | ICD-10-CM | POA: Diagnosis not present

## 2022-07-14 DIAGNOSIS — Z23 Encounter for immunization: Secondary | ICD-10-CM | POA: Diagnosis not present

## 2022-07-14 DIAGNOSIS — R41840 Attention and concentration deficit: Secondary | ICD-10-CM | POA: Diagnosis not present

## 2022-07-14 DIAGNOSIS — Z0000 Encounter for general adult medical examination without abnormal findings: Secondary | ICD-10-CM

## 2022-07-14 MED ORDER — AMPHETAMINE-DEXTROAMPHETAMINE 10 MG PO TABS: 10.0000 mg | ORAL_TABLET | Freq: Every day | ORAL | 0 refills | Status: DC

## 2022-07-14 NOTE — Assessment & Plan Note (Addendum)
New possible diagnosis Of note this is not only 1 setting and may be more due to need for improving marital communication. We did discuss a trial of guanfacine but given his already low blood pressure I would be worried about hypotension in this setting. We will try a course of short acting Adderall at lunchtime given he has no issues at work and would need mainly treatment later in the day.  We reviewed side effects of palpitations, insomnia etc. I encouraged him to continue seeing Dr. Jerrel Ivory for behavioral treatment as well as to encourage his wife to initiate marriage therapy.

## 2022-07-14 NOTE — Progress Notes (Signed)
Patient ID: Ray Park, male    DOB: 09-25-1971, 51 y.o.   MRN: [id number]  This visit was conducted in person.  BP 92/62   Pulse (!) 53   Temp 98.6 F (37 C) (Oral)   Ht 6' 3.5" (1.918 m)   Wt 196 lb (88.9 kg)   SpO2 98%   BMI 24.17 kg/m    CC:  Chief Complaint  Patient presents with   Annual Exam    Subjective:   HPI: Ray Park is a 51 y.o. male presenting on 07/14/2022 for Annual Exam  Reviewed labs with patient in detail No thyroid disease.   Diet: heart healthy diet  Exercise: walking, lifting at work.  Water intake, good, each meals  Body mass index is 24.17 kg/m. Wt Readings from Last 3 Encounters:  07/14/22 196 lb (88.9 kg)  05/26/22 193 lb (87.5 kg)  05/15/22 195 lb (88.5 kg)    Lab Results  Component Value Date   CHOL 167 07/08/2022   HDL 39.00 (L) 07/08/2022   LDLCALC 99 07/08/2022   LDLDIRECT 95.0 11/20/2014   TRIG 142.0 07/08/2022   CHOLHDL 4 07/08/2022   The 10-year ASCVD risk score (Arnett DK, et al., 2019) is: 2.6%   Values used to calculate the score:     Age: 63 years     Sex: Male     Is Non-Hispanic African American: No     Diabetic: No     Tobacco smoker: No     Systolic Blood Pressure: 92 mmHg     Is BP treated: No     HDL Cholesterol: 39 mg/dL     Total Cholesterol: 167 mg/dL  Had ADD testing done with Dr. Reggy Eye  Dx with attention issues  and social anxiety disorder Per patient there were initial issues with testing given computer malfunction.  On repeat testing he was found to have very mild attention issues and only 1 setting.  He states social anxiety disorder issue are not debilitating.  More issues with marital setting.Marland Kitchen  He is working on Special educational needs teacher issues.  He is seeing Dr.  Reggy Eye every 2 weeks.     Relevant past medical, surgical, family and social history reviewed and updated as indicated. Interim medical history since our last visit reviewed. Allergies and medications reviewed and  updated. Outpatient Medications Prior to Visit  Medication Sig Dispense Refill   benzonatate (TESSALON) 200 MG capsule Take 1 capsule (200 mg total) by mouth 2 (two) times daily as needed for cough. 20 capsule 0   No facility-administered medications prior to visit.     Per HPI unless specifically indicated in ROS section below Review of Systems  Constitutional:  Negative for fatigue and fever.  HENT:  Negative for ear pain.   Eyes:  Negative for pain.  Respiratory:  Negative for cough and shortness of breath.   Cardiovascular:  Negative for chest pain, palpitations and leg swelling.  Gastrointestinal:  Positive for constipation. Negative for abdominal pain.  Genitourinary:  Negative for dysuria.  Musculoskeletal:  Negative for arthralgias.  Neurological:  Negative for syncope, light-headedness and headaches.  Psychiatric/Behavioral:  Negative for dysphoric mood.    Objective:  BP 92/62   Pulse (!) 53   Temp 98.6 F (37 C) (Oral)   Ht 6' 3.5" (1.918 m)   Wt 196 lb (88.9 kg)   SpO2 98%   BMI 24.17 kg/m   Wt Readings from Last 3 Encounters:  07/14/22 196 lb (88.9 kg)  05/26/22  193 lb (87.5 kg)  05/15/22 195 lb (88.5 kg)    BP Readings from Last 3 Encounters:  07/14/22 92/62  05/26/22 110/70  07/10/21 112/64      Physical Exam Constitutional:      Appearance: He is well-developed.  HENT:     Head: Normocephalic.     Right Ear: Hearing normal.     Left Ear: Hearing normal.     Nose: Nose normal.  Neck:     Thyroid: No thyroid mass or thyromegaly.     Vascular: No carotid bruit.     Trachea: Trachea normal.  Cardiovascular:     Rate and Rhythm: Normal rate and regular rhythm.     Pulses: Normal pulses.     Heart sounds: Heart sounds not distant. No murmur heard.    No friction rub. No gallop.     Comments: No peripheral edema Pulmonary:     Effort: Pulmonary effort is normal. No respiratory distress.     Breath sounds: Normal breath sounds.  Skin:     General: Skin is warm and dry.     Findings: No rash.  Psychiatric:        Speech: Speech normal.        Behavior: Behavior normal.        Thought Content: Thought content normal.        Results for orders placed or performed in visit on 07/08/22  T3, free  Result Value Ref Range   T3, Free 3.0 2.3 - 4.2 pg/mL  T4, free  Result Value Ref Range   Free T4 0.69 0.60 - 1.60 ng/dL  TSH  Result Value Ref Range   TSH 3.08 0.35 - 5.50 uIU/mL  PSA  Result Value Ref Range   PSA 0.92 0.10 - 4.00 ng/mL  Hepatitis C antibody  Result Value Ref Range   Hepatitis C Ab NON-REACTIVE NON-REACTIVE  Comprehensive metabolic panel  Result Value Ref Range   Sodium 140 135 - 145 mEq/L   Potassium 4.1 3.5 - 5.1 mEq/L   Chloride 104 96 - 112 mEq/L   CO2 30 19 - 32 mEq/L   Glucose, Bld 95 70 - 99 mg/dL   BUN 18 6 - 23 mg/dL   Creatinine, Ser 1.18 0.40 - 1.50 mg/dL   Total Bilirubin 0.6 0.2 - 1.2 mg/dL   Alkaline Phosphatase 65 39 - 117 U/L   AST 21 0 - 37 U/L   ALT 25 0 - 53 U/L   Total Protein 6.9 6.0 - 8.3 g/dL   Albumin 4.5 3.5 - 5.2 g/dL   GFR 70.60 >60.00 mL/min   Calcium 9.7 8.4 - 10.5 mg/dL  Lipid panel  Result Value Ref Range   Cholesterol 167 0 - 200 mg/dL   Triglycerides 142.0 0.0 - 149.0 mg/dL   HDL 39.00 (L) >39.00 mg/dL   VLDL 28.4 0.0 - 40.0 mg/dL   LDL Cholesterol 99 0 - 99 mg/dL   Total CHOL/HDL Ratio 4    NonHDL 127.70      COVID 19 screen:  No recent travel or known exposure to COVID19 The patient denies respiratory symptoms of COVID 19 at this time. The importance of social distancing was discussed today.   Assessment and Plan   The patient's preventative maintenance and recommended screening tests for an annual wellness exam were reviewed in full today. Brought up to date unless services declined.  Counselled on the importance of diet, exercise, and its role in overall health and mortality.  The patient's FH and SH was reviewed, including their home life,  tobacco status, and drug and alcohol status.   Vaccines: Uptodate with COVID19 x 4,  Td , flu  and shingrix uptodate  Prostate Cancer Screen:  Lab Results  Component Value Date   PSA 0.92 07/08/2022   PSA 0.86 07/03/2021   PSA 0.91 06/21/2020  Colon Cancer Screen:  05/19/21 plan repeat in 5 years      Smoking Status: none ETOH/ drug use:  Every few weeks/none  HIV screen:  none Hep C: done Problem List Items Addressed This Visit     Attention deficit    New possible diagnosis Of note this is not only 1 setting and may be more due to need for improving marital communication. We did discuss a trial of guanfacine but given his already low blood pressure I would be worried about hypotension in this setting. We will try a course of short acting Adderall at lunchtime given he has no issues at work and would need mainly treatment later in the day.  We reviewed side effects of palpitations, insomnia etc. I encouraged him to continue seeing Dr. Jerrel Ivory for behavioral treatment as well as to encourage his wife to initiate marriage therapy.      Skin lesion of right leg    Chronic not clearly changing  Most likely secondary to aging skin changes/sun exposure. Given fair skin I recommended full body skin check and dermatologist to look at this as well discussing other questions patient has.      Relevant Orders   Ambulatory referral to Dermatology   Other Visit Diagnoses     Routine general medical examination at a health care facility    -  Primary   Need for influenza vaccination       Relevant Orders   Flu Vaccine QUAD 6+ mos PF IM (Fluarix Quad PF) (Completed)          Eliezer Lofts, MD

## 2022-07-14 NOTE — Patient Instructions (Signed)
Start lunchtime medication for attention

## 2022-07-14 NOTE — Assessment & Plan Note (Signed)
Chronic not clearly changing  Most likely secondary to aging skin changes/sun exposure. Given fair skin I recommended full body skin check and dermatologist to look at this as well discussing other questions patient has.

## 2022-07-16 ENCOUNTER — Encounter: Payer: Self-pay | Admitting: Psychology

## 2022-07-16 ENCOUNTER — Ambulatory Visit: Payer: BC Managed Care – PPO | Admitting: Psychology

## 2022-07-16 DIAGNOSIS — F908 Attention-deficit hyperactivity disorder, other type: Secondary | ICD-10-CM

## 2022-07-16 DIAGNOSIS — F4010 Social phobia, unspecified: Secondary | ICD-10-CM | POA: Diagnosis not present

## 2022-07-16 NOTE — Progress Notes (Signed)
[facility] Counselor/Therapist Progress Note  Patient ID: Ray Park, MRN: [id number],    Date: 07/16/2022  Time Spent: 2:00 - 2:50 pm   Treatment Type: Individual Therapy  Met with patient for therapy session.  Patient was at the clinic and session was conducted from the therapist's office in person.    Reported Symptoms: Patient appears to have some processing deficits consistent with ADHD, and it is possible that he was  able to compensate for attention problems in lower grades with high intelligence, but he is suffering more when greater organization and independence was required. 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 Recommendations include participating individual counseling to address these issues.   Mental Status Exam: Appearance:  Casually dressed and neatly groomed      Behavior: Appropriate and Sharing  Motor: Normal  Speech/Language:  Clear and Coherent and Normal Rate  Affect: Full, appropriate  Mood: Euthymic  Thought process: normal  Thought content:   WNL  Sensory/Perceptual disturbances:   WNL  Orientation: oriented to person, place, time/date, and situation  Attention: Good  Concentration: Good  Memory: WNL  Fund of knowledge:  Good  Insight:   Good  Judgment:  Good  Impulse Control: Good   Risk Assessment: Danger to Self:  No Self-injurious Behavior: No Danger to Others: No Duty to Warn:no Physical Aggression / Violence:No  Access to Firearms a concern: No  Gang Involvement:No   Subjective: Patient stated that he has been calmer overall since the last session , as he has been able to accept mistakes and feedback from his wife calmly unless she becomes overly critical or bringing up the same issue repeatedly.  He is about start Adderall to improve attention deficits and  realizes that he needs more repetition with certain household activities (cooking) to become proficient at them where he can bypass his executive dysfunction.          Interventions: Executive function compensation strategies were discussed including using external aids for structure and reminders, along with building habits through repeated practice.    Diagnosis:Social anxiety disorder  Attention deficit hyperactivity disorder (ADHD), other type  Plan: Regular therapy sessions to continue focusing on increasing awareness, assertiveness, and coping skills in order to lessen avoidance of difficult conversations. Wife may attend a session for a separate consult regarding patient.  Patient consented to this verbally.  Treatment plan was reviewed with patient/parents.  Patient/parents expressed agreement with the goals, objectives, and treatment methods identified in the treatment plan.    Treatment Plan Client Abilities/Strengths  Intelligence and thoughtful   Client Treatment Preferences  In person sessions   Client Statement of Needs  Patient appears to have some processing deficits consistent with ADHD, and it is possible that he was able to compensate for attention problems in lower grades with high intelligence, but he is suffering more when greater organization and independence was required. 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 Recommendations include participating individual counseling to address these issues.   Problems Addressed  Social Anxiety, Attention Deficit Disorder (ADD) - Adult   Goals 1. Interact socially without undue fear or anxiety. Objective Learn and implement calming and coping strategies to manage anxiety symptoms during moments of  social anxiety and lead to a more relaxed state in general. Target  Date: 2022-09-19  Progress: 80   Objective Identify, challenge, and replace biased, fearful self-talk with reality-based, positive self-talk. Target Date: 2022-12-19  Progress: 70   2. Sustain attention and concentration for consistently longer periods of  time. Objective Learn and implement skills to reduce the disruptive influence of distractibility. Target Date: 2022-09-19  Progress:80  Objective Learn and implement skills to reduce procrastination and avoidance. Target Date: 2022-12-19  Progress: 82   Haze Antillon, PhD                                                                                                                     [name], PhD

## 2022-07-30 ENCOUNTER — Ambulatory Visit: Payer: BC Managed Care – PPO | Admitting: Psychology

## 2022-07-30 ENCOUNTER — Encounter: Payer: Self-pay | Admitting: Psychology

## 2022-07-30 DIAGNOSIS — F908 Attention-deficit hyperactivity disorder, other type: Secondary | ICD-10-CM | POA: Diagnosis not present

## 2022-07-30 DIAGNOSIS — F4010 Social phobia, unspecified: Secondary | ICD-10-CM

## 2022-07-30 NOTE — Progress Notes (Signed)
[facility] Counselor/Therapist Progress Note  Patient ID: Ray Park, MRN: [id number],    Date: 07/30/2022  Time Spent: 4:00 - 5:00 pm   Treatment Type: Individual Therapy  Met with patient for therapy session.  Patient was at the clinic and session was conducted from the therapist's office in person.    Reported Symptoms: Patient appears to have some processing deficits consistent with ADHD, and it is possible that he was  able to compensate for attention problems in lower grades with high intelligence, but he is suffering more when greater organization and independence was required. 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 Recommendations include participating individual counseling to address these issues.   Patient has been going through much marital distress for the past three years.    Mental Status Exam: Appearance:  Casually dressed and neatly groomed      Behavior: Appropriate and Sharing  Motor: Normal  Speech/Language:  Clear and Coherent and Normal Rate  Affect: Full, appropriate  Mood: Frustrated  Thought process: normal  Thought content:   WNL  Sensory/Perceptual disturbances:   WNL  Orientation: oriented to person, place, time/date, and situation  Attention: Good  Concentration: Good  Memory: WNL  Fund of knowledge:  Good  Insight:   Good  Judgment:  Good  Impulse Control: Good   Risk Assessment: Danger to Self:  No Self-injurious Behavior: No Danger to Others: No Duty to Warn:no Physical Aggression / Violence:No  Access to Firearms a concern: No  Gang Involvement:No   Subjective: Patient stated that he has been more frustrated since the last session, as his wife continues to give him excessive detail and instruction and becomes highly and overly  critical when he misses even a minor detail.  He started Adderall when was helpful the first week, gave him headaches the second week, and has had little effect the third week.  He also takes it during the day so it wears off by the time he comes home, where it is needed most.         Interventions: Assertiveness strategies were discussed with emphasis on others giving him instruction in ways he can handle (e.g., giving less detail and highlighting the most important ones, along with continuing the assertive approach even when the other person is being verbally aggressive).      Diagnosis:Social anxiety disorder  Attention deficit hyperactivity disorder (ADHD), other type  Plan: Regular therapy sessions to continue focusing on increasing awareness, assertiveness, and coping skills in order to lessen avoidance of difficult conversations. Wife may attend a session for a separate consult regarding patient.  Patient consented to this verbally.  Treatment plan was reviewed with patient/parents.  Patient/parents expressed agreement with the goals, objectives, and treatment methods identified in the treatment plan.    Treatment Plan Client Abilities/Strengths  Intelligence and thoughtful   Client Treatment Preferences  In person sessions   Client Statement of Needs  Patient appears to have some processing deficits consistent with ADHD, and it is possible that he was able to compensate for attention problems in lower grades with high intelligence, but he is suffering more when greater organization and independence was required. 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 Recommendations include participating individual counseling to address these issues.   Problems Addressed  Social Anxiety, Attention Deficit Disorder (ADD) - Adult    Goals 1. Interact socially without undue fear or anxiety. Objective Learn and implement calming and coping strategies to manage anxiety symptoms during moments of social anxiety and lead to a more relaxed state in general. Target Date: 2022-09-19  Progress: 85   Objective Identify, challenge, and replace biased, fearful self-talk with reality-based, positive self-talk. Target Date: 2022-12-19  Progress: 75   2. Sustain attention and concentration for consistently longer periods of  time. Objective Learn and implement skills to reduce the disruptive influence of distractibility. Target Date: 2022-09-19  Progress:85  Objective Learn and implement skills to reduce procrastination and avoidance. Target Date: 2022-12-19  Progress: 90   [name], PhD

## 2022-08-12 ENCOUNTER — Encounter: Payer: Self-pay | Admitting: Family Medicine

## 2022-08-12 ENCOUNTER — Ambulatory Visit: Payer: BC Managed Care – PPO | Admitting: Family Medicine

## 2022-08-12 DIAGNOSIS — R41840 Attention and concentration deficit: Secondary | ICD-10-CM

## 2022-08-12 MED ORDER — AMPHETAMINE-DEXTROAMPHET ER 20 MG PO CP24: 20.0000 mg | ORAL_CAPSULE | Freq: Every day | ORAL | 0 refills | Status: DC

## 2022-08-12 NOTE — Assessment & Plan Note (Signed)
Chronic, no side effects to short acting Adderall but also no benefit.  We will change to extended release Adderall and increase the dose to 20 mg.  He will continue to take this medication at noon to hopefully have improvement with symptoms at home without insomnia.  He will follow-up in 2 months

## 2022-08-12 NOTE — Patient Instructions (Signed)
Stop Adderall and change to Adderall XR . Increase dose to 20 mg daily.  Take around noon as long as no  insomnia side effects.

## 2022-08-12 NOTE — Progress Notes (Signed)
Patient ID: Ray Park, male    DOB: 02-02-1971, 51 y.o.   MRN: [id number]  This visit was conducted in person.  BP 116/72 (BP Location: Left Arm, Patient Position: Sitting)   Pulse (!) 58   Temp (!) 97.1 F (36.2 C) (Skin)   Ht 6' 3.5" (1.918 m)   Wt 198 lb (89.8 kg)   SpO2 99%   BMI 24.42 kg/m    CC:  Chief Complaint  Patient presents with   Follow-up    On attention deficit    Subjective:   HPI: Ray Park is a 51 y.o. male presenting on 08/12/2022 for Follow-up (On attention deficit)    ADD Compliant with meds: He felt initially more alert, after that he didn't note much,  had occ headache Wife states  she has not noted a difference given it is not working by the time she is around him. Was taking around 12 each day.  benefit from med (ie increase in concentration): none change in mood: none change in appetite: none Insomnia: none tremor:none compliant with behavioral modification: yes  Wt Readings from Last 3 Encounters:  08/12/22 198 lb (89.8 kg)  07/14/22 196 lb (88.9 kg)  05/26/22 193 lb (87.5 kg)   BP Readings from Last 3 Encounters:  08/12/22 116/72  07/14/22 92/62  05/26/22 110/70        Relevant past medical, surgical, family and social history reviewed and updated as indicated. Interim medical history since our last visit reviewed. Allergies and medications reviewed and updated. Outpatient Medications Prior to Visit  Medication Sig Dispense Refill   amphetamine-dextroamphetamine (ADDERALL) 10 MG tablet Take 1 tablet (10 mg total) by mouth daily before lunch. 30 tablet 0   No facility-administered medications prior to visit.     Per HPI unless specifically indicated in ROS section below Review of Systems Objective:  BP 116/72 (BP Location: Left Arm, Patient Position: Sitting)   Pulse (!) 58   Temp (!) 97.1 F (36.2 C) (Skin)   Ht 6' 3.5" (1.918 m)   Wt 198 lb (89.8 kg)   SpO2 99%   BMI 24.42 kg/m   Wt Readings from Last 3  Encounters:  08/12/22 198 lb (89.8 kg)  07/14/22 196 lb (88.9 kg)  05/26/22 193 lb (87.5 kg)      Physical Exam    Results for orders placed or performed in visit on 07/08/22  T3, free  Result Value Ref Range   T3, Free 3.0 2.3 - 4.2 pg/mL  T4, free  Result Value Ref Range   Free T4 0.69 0.60 - 1.60 ng/dL  TSH  Result Value Ref Range   TSH 3.08 0.35 - 5.50 uIU/mL  PSA  Result Value Ref Range   PSA 0.92 0.10 - 4.00 ng/mL  Hepatitis C antibody  Result Value Ref Range   Hepatitis C Ab NON-REACTIVE NON-REACTIVE  Comprehensive metabolic panel  Result Value Ref Range   Sodium 140 135 - 145 mEq/L   Potassium 4.1 3.5 - 5.1 mEq/L   Chloride 104 96 - 112 mEq/L   CO2 30 19 - 32 mEq/L   Glucose, Bld 95 70 - 99 mg/dL   BUN 18 6 - 23 mg/dL   Creatinine, Ser 1.18 0.40 - 1.50 mg/dL   Total Bilirubin 0.6 0.2 - 1.2 mg/dL   Alkaline Phosphatase 65 39 - 117 U/L   AST 21 0 - 37 U/L   ALT 25 0 - 53 U/L   Total Protein 6.9  6.0 - 8.3 g/dL   Albumin 4.5 3.5 - 5.2 g/dL   GFR 29.47 >65.46 mL/min   Calcium 9.7 8.4 - 10.5 mg/dL  Lipid panel  Result Value Ref Range   Cholesterol 167 0 - 200 mg/dL   Triglycerides 503.5 0.0 - 149.0 mg/dL   HDL 46.56 (L) >81.27 mg/dL   VLDL 51.7 0.0 - 00.1 mg/dL   LDL Cholesterol 99 0 - 99 mg/dL   Total CHOL/HDL Ratio 4    NonHDL 127.70      COVID 19 screen:  No recent travel or known exposure to COVID19 The patient denies respiratory symptoms of COVID 19 at this time. The importance of social distancing was discussed today.   Assessment and Plan Problem List Items Addressed This Visit     Attention deficit    Chronic, no side effects to short acting Adderall but also no benefit.  We will change to extended release Adderall and increase the dose to 20 mg.  He will continue to take this medication at noon to hopefully have improvement with symptoms at home without insomnia.  He will follow-up in 2 months          Kerby Nora, MD

## 2022-08-13 ENCOUNTER — Ambulatory Visit: Payer: BC Managed Care – PPO | Admitting: Psychology

## 2022-08-27 ENCOUNTER — Encounter: Payer: Self-pay | Admitting: Psychology

## 2022-08-27 ENCOUNTER — Ambulatory Visit: Payer: BC Managed Care – PPO | Admitting: Psychology

## 2022-08-27 DIAGNOSIS — F4010 Social phobia, unspecified: Secondary | ICD-10-CM

## 2022-08-27 DIAGNOSIS — F908 Attention-deficit hyperactivity disorder, other type: Secondary | ICD-10-CM

## 2022-08-27 NOTE — Progress Notes (Signed)
[facility] Counselor/Therapist Progress Note  Patient ID: Ray Park, MRN: [id number],    Date: 08/27/2022  Time Spent: 4:10 - 5:10 pm   Treatment Type: Individual Therapy  Met with patient for therapy session.  Patient was at the clinic and session was conducted from the therapist's office in person.    Reported Symptoms: Patient appears to have some processing deficits consistent with ADHD, and it is possible that he was  able to compensate for attention problems in lower grades with high intelligence, but he is suffering more when greater organization and independence was required. 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 Recommendations include participating individual counseling to address these issues.   Patient has been going through much marital distress for the past three years.    Mental Status Exam: Appearance:  Casually dressed and neatly groomed      Behavior: Appropriate and Sharing  Motor: Normal  Speech/Language:  Clear and Coherent and Normal Rate  Affect: Full, appropriate  Mood: Euthymic  Thought process: normal  Thought content:   WNL  Sensory/Perceptual disturbances:   WNL  Orientation: oriented to person, place, time/date, and situation  Attention: Good  Concentration: Good  Memory: WNL  Fund of knowledge:  Good  Insight:   Good  Judgment:  Good  Impulse Control: Good   Risk Assessment: Danger to Self:  No Self-injurious Behavior: No Danger to Others: No Duty to Warn:no Physical Aggression / Violence:No  Access to Firearms a concern: No  Gang Involvement:No   Subjective: Patient stated that his wife continues to give him excessive detail and instruction and becomes highly and overly critical when he misses even a minor detail.  He stated  that that a coworker of his had similar issues in their his and the coworker recently left his wife.  He has continued taking the Adderall and increased the dosage due to it having little positive effect.  However, he still has noticed little positive effect even after the increase.  He most concerned about how she dismisses every time he tried to discuss her role in their strained marriage.       Interventions: It was discussed how medication will only increase alertness and not executive function deficits/organization.  Those need to be addressed through using visual guides, setting up designated planning times, and building habits/routines.  Also discussed was the acknowledgement that the marital issues have to recognized as a couple problem and not an individual problem in order for the relationship to move forward.    Diagnosis:Social anxiety disorder  Attention deficit hyperactivity disorder (ADHD), other type  Plan: Regular therapy sessions to continue focusing on increasing awareness, assertiveness, and coping skills in order to lessen avoidance of difficult conversations. Wife may attend a session for a separate consult regarding patient.  Patient consented to this verbally.  Treatment plan was reviewed with patient/parents.  Patient/parents expressed agreement with the goals, objectives, and treatment methods identified in the treatment plan.    Treatment Plan Client Abilities/Strengths  Intelligence and thoughtful   Client Treatment Preferences  In person sessions   Client Statement of Needs  Patient appears to have some processing deficits consistent with ADHD, and it is possible that he was able to compensate for attention problems in  lower grades with high intelligence, but he is suffering more when greater organization and independence was required. 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 Recommendations include participating individual counseling to address these issues.   Problems Addressed  Social Anxiety, Attention Deficit Disorder (ADD) - Adult   Goals 1. Interact socially without undue fear or anxiety. Objective Learn and implement calming and coping strategies to manage anxiety symptoms during moments of social anxiety and lead to a more relaxed state in general. Target Date: 2022-09-19  Progress: 90   Objective Identify, challenge, and replace biased, fearful self-talk with reality-based, positive self-talk. Target Date: 2022-12-19  Progress: 80   2. Sustain attention and concentration for consistently longer periods of  time. Objective Learn and implement skills to reduce the disruptive influence of distractibility. Target Date: 2022-09-19  Progress:90  Objective Learn and implement skills to reduce procrastination and avoidance. Target Date: 2022-12-19  Progress: 95   [name], PhD                                                                                                                  [name], PhD

## 2022-09-10 ENCOUNTER — Other Ambulatory Visit: Payer: Self-pay | Admitting: Family Medicine

## 2022-09-10 ENCOUNTER — Ambulatory Visit: Payer: BC Managed Care – PPO | Admitting: Psychology

## 2022-09-10 ENCOUNTER — Encounter: Payer: Self-pay | Admitting: Psychology

## 2022-09-10 DIAGNOSIS — F908 Attention-deficit hyperactivity disorder, other type: Secondary | ICD-10-CM

## 2022-09-10 DIAGNOSIS — F4010 Social phobia, unspecified: Secondary | ICD-10-CM

## 2022-09-10 NOTE — Progress Notes (Signed)
                [facility] Counselor/Therapist Progress Note  Patient ID: Ray Park, MRN: [id number],    Date: 09/10/2022  Time Spent: 4:05 - 5:05 pm   Treatment Type: Individual Therapy  Met with patient for therapy session.  Patient was at the clinic and session was conducted from the therapist's office in person.    Reported Symptoms: Patient appears to have some processing deficits consistent with ADHD, and it is possible that he was able to compensate for attention problems in lower grades with high intelligence, but he is suffering more when greater organization and independence was required. 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 Recommendations include participating individual counseling to address these issues.   Patient has been going through much marital distress for the past three years.    Mental Status Exam: Appearance:  Casually dressed and neatly groomed      Behavior: Appropriate and Sharing  Motor: Normal  Speech/Language:  Clear and Coherent and Normal Rate  Affect: Full, appropriate  Mood: Anxious  Thought process: normal  Thought content:   WNL  Sensory/Perceptual disturbances:   WNL  Orientation: oriented to person, place, time/date, and situation  Attention: Good  Concentration: Good  Memory: WNL  Fund of knowledge:  Good  Insight:   Good  Judgment:  Good  Impulse Control: Good   Risk Assessment: Danger to Self:  No Self-injurious Behavior: No Danger to Others: No Duty to Warn:no Physical Aggression / Violence:No  Access to Firearms a concern: No  Gang Involvement:No   Subjective: Patient stated that not much has changed in his marital relations since the last sessions.  He mentioned that he will often forget a step or detail when distracted by other  thought with wife acting intensely when he makes mistakes.  He's not sure how much of this he can take before wanting to leave.  When he tries to discuss his concerns she puts it all back on him stating that if he just did things correctly there would not be any problems.         Interventions: Mindfulness practices were discussed to help patient pay full attention to what he is doing.  Emphasis was on being emotionally invested in the activity and saying the steps out loud to force his mind to focus on the activity.  Also discussed asserting his feelings calmly regardless of how they will be received.    Diagnosis:Social anxiety disorder  Attention deficit hyperactivity disorder (ADHD), other type  Plan: Regular therapy sessions to continue focusing on increasing awareness, assertiveness, and coping skills in order to lessen avoidance of difficult conversations. Wife may attend a session for a separate consult regarding patient.  Patient consented to this verbally.  Treatment plan was reviewed with patient/parents.  Patient/parents expressed agreement with the goals, objectives, and treatment methods identified in the treatment plan.    Treatment Plan Client Abilities/Strengths  Intelligence and thoughtful   Client Treatment Preferences  In person sessions   Client Statement of Needs  Patient appears to have some processing deficits consistent with ADHD, and it is possible that he was able to compensate for attention problems in lower grades with high intelligence, but he is suffering more when greater organization and independence was required. 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 Recommendations include participating individual counseling to address these issues.   Problems Addressed  Social Anxiety, Attention Deficit  Disorder (ADD) - Adult   Goals 1. Interact socially without undue fear or anxiety. Objective Learn and implement calming and coping strategies to manage anxiety symptoms during moments of social anxiety and lead to a more relaxed state in general. Target Date: 2022-09-19  Progress: 95   Objective Identify, challenge, and replace biased, fearful self-talk with reality-based, positive self-talk. Target Date: 2022-12-19  Progress: 90   2. Sustain attention and concentration for consistently longer periods of  time. Objective Learn and implement skills to reduce the disruptive influence of distractibility. Target Date: 2022-09-19  Progress:95  Objective Learn and implement skills to reduce procrastination and avoidance. Target Date: 2022-12-19  Progress: 95   [name], PhD

## 2022-09-10 NOTE — Telephone Encounter (Signed)
Last office visit 08/12/22 for ADD.  Last refilled 08/12/22 for #30 with no refills.

## 2022-09-18 ENCOUNTER — Other Ambulatory Visit (HOSPITAL_BASED_OUTPATIENT_CLINIC_OR_DEPARTMENT_OTHER): Payer: Self-pay

## 2022-09-18 MED ORDER — COMIRNATY 30 MCG/0.3ML IM SUSY
PREFILLED_SYRINGE | INTRAMUSCULAR | 0 refills | Status: DC
  Filled 2022-09-18: qty 0.3, 1d supply, fill #0

## 2022-09-24 ENCOUNTER — Ambulatory Visit (INDEPENDENT_AMBULATORY_CARE_PROVIDER_SITE_OTHER): Payer: BC Managed Care – PPO | Admitting: Psychology

## 2022-09-24 ENCOUNTER — Encounter: Payer: Self-pay | Admitting: Psychology

## 2022-09-24 DIAGNOSIS — F4010 Social phobia, unspecified: Secondary | ICD-10-CM

## 2022-09-24 DIAGNOSIS — F908 Attention-deficit hyperactivity disorder, other type: Secondary | ICD-10-CM

## 2022-09-24 NOTE — Progress Notes (Signed)
[facility] Counselor/Therapist Progress Note  Patient ID: Ray Park, MRN: [id number],    Date: 09/24/2022  Time Spent: 4:05 - 5:05 pm   Treatment Type: Individual Therapy  Met with patient for therapy session.  Patient was at the clinic and session was conducted from the therapist's office in person.    Reported Symptoms: Patient appears to have some processing deficits consistent with ADHD, and it is possible that he was able to compensate for attention problems in lower grades with high intelligence, but he is suffering more when greater organization and independence was required. 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 Recommendations include participating individual counseling to address these issues.   Patient has been going through much marital distress for the past three years.    Mental Status Exam: Appearance:  Casually dressed and neatly groomed      Behavior: Appropriate and Sharing  Motor: Normal  Speech/Language:  Clear and Coherent and Normal Rate  Affect: Full, appropriate  Mood: Frustrated  Thought process: normal  Thought content:   WNL  Sensory/Perceptual disturbances:   WNL  Orientation: oriented to person, place, time/date, and situation  Attention: Good  Concentration: Good  Memory: WNL  Fund of knowledge:  Good  Insight:   Good  Judgment:  Good  Impulse Control: Good   Risk Assessment: Danger to Self:  No Self-injurious Behavior: No Danger to Others: No Duty to Warn:no Physical Aggression / Violence:No  Access to Firearms a concern: No  Gang Involvement:No   Subjective: Patient stated that marital relations continue to be strained to the paint where he is hesitant to connect with her physically as well as emotionally, often scared that he is going to say or do the wrong  thing and get into trouble (face a strong emotional reaction from her or her blaming him for not following her details precisely).  He mentioned being resentful and passive aggressive at times because he does not feel like he can be his authentic self around her.  He stated that he often does not have the tine or energy to wanted to get into and extended argument/discussion and that she will often back away when he starts to get stronger in his conviction.        Interventions: Assertive communication was discussed to help patient express how her behavior makes him feel.  Emphasis was on stating his feelings respectfully but directly and honestly so she can understand how he truly feels.    Assessment: Patient may be more afraid of the ultimate result of him expressing his feelings honestly than her immediate reactions.  He will have to create the opportunity for this to happen rather than waiting for the best time.    Diagnosis:Social anxiety disorder  Attention deficit hyperactivity disorder (ADHD), other type  Plan: Regular therapy sessions to continue focusing on increasing awareness, assertiveness, and coping skills in order to lessen avoidance of difficult conversations. Wife may attend a session for a separate consult regarding patient.  Patient consented to this verbally.  Treatment plan was reviewed with patient/parents.  Patient/parents expressed agreement with the goals, objectives, and treatment methods identified in the treatment plan.    Treatment Plan Client Abilities/Strengths  Intelligence and thoughtful   Client Treatment Preferences  In person sessions   Client Statement of Needs  Patient appears to have some processing  deficits consistent with ADHD, and it is possible that he was able to compensate for attention problems in lower grades with high intelligence, but he is suffering more when greater organization and independence was required. 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 Recommendations include participating individual counseling to address these issues.   Problems Addressed  Social Anxiety, Attention Deficit Disorder (ADD) - Adult   Goals 1. Interact socially without undue fear or anxiety. Objective Learn and implement calming and coping strategies to manage anxiety symptoms during moments of social anxiety and lead to a more relaxed state in general. Target Date: 2022-09-23  Progress: 100   Objective Identify, challenge, and replace biased, fearful self-talk with reality-based, positive self-talk. Target Date: 2022-12-19  Progress: 90   2. Sustain attention and concentration for consistently longer periods of  time. Objective Learn and implement skills to reduce the disruptive influence of distractibility. Target Date: 2022-09-23  Progress:100  Objective Learn and implement skills to reduce procrastination and avoidance. Target Date: 2022-12-19  Progress: 95   [name], PhD                                                                                                                  [name], PhD

## 2022-10-08 ENCOUNTER — Ambulatory Visit: Payer: BC Managed Care – PPO | Admitting: Psychology

## 2022-10-14 ENCOUNTER — Ambulatory Visit: Payer: BC Managed Care – PPO | Admitting: Family Medicine

## 2022-10-14 VITALS — BP 138/88 | HR 60 | Temp 98.1°F | Ht 75.5 in | Wt 196.4 lb

## 2022-10-14 DIAGNOSIS — R41840 Attention and concentration deficit: Secondary | ICD-10-CM

## 2022-10-14 MED ORDER — AMPHETAMINE-DEXTROAMPHET ER 25 MG PO CP24: 25.0000 mg | ORAL_CAPSULE | ORAL | 0 refills | Status: DC

## 2022-10-14 NOTE — Progress Notes (Signed)
ADD Reviewed last office visit from August 12, 2022.  At that office visit we changed the Adderall to extended release and increased his dose to 20 mg daily.  He plan to take this medication at noon given most of his attention issues were thought to be at home per his wife.  Compliant with meds: Yes benefit from med (ie increase in concentration):  he has not noted any improvement in concentration, focus.  Wife, who felt strongly about him starting this medication, also does not feel there has been any change. Does have some initial side effects.Marland Kitchen upset stomach, sleepy feeling ,headache He is trying to speak u[ more and communicate more clearly tp wife. He has encouraged her to go to counseling, marriage counseling etc. change in mood: none change in appetite:none Insomnia: none tremor: none compliant with behavioral modification: good.  PMH and SH reviewed  ROS: Per HPI unless specifically indicated in ROS section   Meds, vitals, and allergies reviewed.  Wt Readings from Last 3 Encounters:  10/14/22 196 lb 6.4 oz (89.1 kg)  08/12/22 198 lb (89.8 kg)  07/14/22 196 lb (88.9 kg)   BP Readings from Last 3 Encounters:  10/14/22 138/88  08/12/22 116/72  07/14/22 92/62   He has continues to see counselor.  PDMP reviewed during this encounter.   GEN: nad, alert and oriented, affect wnl and appropriate HEENT: mucous membranes moist NECK: supple w/o LA CV: rrr.  PULM: ctab, no inc wob ABD: soft, +bs EXT: no edema CN 2-12 wnl, s/s/dtr wnl x4.  No tremor.  Problem List Items Addressed This Visit     Attention deficit - Primary    Chronic, questionable diagnosis Minimal improvement in areas of concern per both patient and wife. Initial side effects to Adderall and now currently blood pressure significantly elevated after starting stimulant medication.  He will follow blood pressure at home and only if blood pressure is not above 140/90 will he start and continue Adderall XR 25  mg daily. He will continue counseling to work on marital issues and I encouraged him to speak with his wife about marriage counseling to improve their relationship.      Meds ordered this encounter  Medications   DISCONTD: amphetamine-dextroamphetamine (ADDERALL XR) 25 MG 24 hr capsule    Sig: Take 1 capsule by mouth every morning.    Dispense:  30 capsule    Refill:  0   amphetamine-dextroamphetamine (ADDERALL XR) 25 MG 24 hr capsule    Sig: Take 1 capsule by mouth every morning. Fill after 11/14/22    Dispense:  30 capsule    Refill:  0

## 2022-10-14 NOTE — Patient Instructions (Signed)
Since the increase in dosage of Adderall XR, your blood pressure is borderline high. Please check your blood pressure daily.Marland Kitchen on current dose.Marland Kitchen as long as BP is not > 140/90 you can try an increase in Adderall XR dose to 25 mg daily. Please continue to follow blood pressure checks. If BP goes above goal < 140/90.Marland Kitchen please call to return to previous dosage.    BP Readings from Last 3 Encounters:  10/14/22 138/88  08/12/22 116/72  07/14/22 92/62

## 2022-10-14 NOTE — Assessment & Plan Note (Signed)
Chronic, questionable diagnosis Minimal improvement in areas of concern per both patient and wife. Initial side effects to Adderall and now currently blood pressure significantly elevated after starting stimulant medication.  He will follow blood pressure at home and only if blood pressure is not above 140/90 will he start and continue Adderall XR 25 mg daily. He will continue counseling to work on marital issues and I encouraged him to speak with his wife about marriage counseling to improve their relationship.

## 2022-10-26 ENCOUNTER — Encounter: Payer: Self-pay | Admitting: Psychology

## 2022-10-26 ENCOUNTER — Ambulatory Visit: Payer: BC Managed Care – PPO | Admitting: Psychology

## 2022-10-26 DIAGNOSIS — F908 Attention-deficit hyperactivity disorder, other type: Secondary | ICD-10-CM | POA: Diagnosis not present

## 2022-10-26 DIAGNOSIS — F4010 Social phobia, unspecified: Secondary | ICD-10-CM | POA: Diagnosis not present

## 2022-10-26 NOTE — Progress Notes (Signed)
[facility] Counselor/Therapist Progress Note  Patient ID: Ray Park, MRN: [id number],    Date: 10/26/2022  Time Spent: 9:00 - 9:50 am   Treatment Type: Individual Therapy  Met with patient for therapy session.  Patient was at the clinic and session was conducted from the therapist's office in person.    Reported Symptoms: Patient appears to have some processing deficits consistent with ADHD, and it is possible that he was able to compensate for attention problems in lower grades with high intelligence, but he is suffering more when greater organization and independence was required. 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 Recommendations include participating individual counseling to address these issues.   Patient has been going through much marital distress for the past three years.    Mental Status Exam: Appearance:  Casually dressed and neatly groomed      Behavior: Appropriate and Sharing  Motor: Normal  Speech/Language:  Clear and Coherent and Normal Rate  Affect: Full, appropriate  Mood: Euthymic  Thought process: normal  Thought content:   WNL  Sensory/Perceptual disturbances:   WNL  Orientation: oriented to person, place, time/date, and situation  Attention: Good  Concentration: Good  Memory: WNL  Fund of knowledge:  Good  Insight:   Good  Judgment:  Good  Impulse Control: Good   Risk Assessment: Danger to Self:  No Self-injurious Behavior: No Danger to Others: No Duty to Warn:no Physical Aggression / Violence:No  Access to Firearms a concern: No  Gang Involvement:No   Subjective: Patient stated that marital relations continue to be strained to the point where there is no longer any affection between the two of them.  He indicated being more assertive regarding his not tolerating his  wife insulting him. He stated that the medication has not been helpful as he will still forget details related to her strict cleaning and household rituals and he has been experiencing headaches and stomach upset from the 25 mg. of Adderall.  He has not made much strides in persuading his wife to let up on some of the rituals, as her mother reinforces them and makes it seem like he has the problem.                Interventions: Assertive communication and prioritization of values was discussed to help patient express what is most important to him in how he wants to live.  Emphasis was on focusing on what he can control (doing and saying what is important to him) regardless of others responses.    Assessment: Patient making some progress in asserting himself in one area, but will need to do more of it in other areas to be able to determine whether he can return  to the reciprocal relationship he and his wife used to have.   Diagnosis:Social anxiety disorder  Attention deficit hyperactivity disorder (ADHD), other type  Plan: Regular therapy sessions to continue focusing on increasing awareness, assertiveness, and coping skills in order to lessen avoidance of difficult conversations. Wife may attend a session for a separate consult regarding patient.  Patient consented to this verbally.  Treatment plan was reviewed with patient/parents.  Patient/parents expressed agreement with the goals, objectives, and treatment methods identified in the treatment plan.    Treatment Plan Client Abilities/Strengths  Intelligence and thoughtful   Client Treatment Preferences  In person sessions  Client Statement of Needs  Patient appears to have some processing deficits consistent with ADHD, and it is possible that he was able to compensate for attention problems in lower grades with high intelligence, but he is suffering more when greater organization and independence was required. 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 Recommendations include participating individual counseling to address these issues.   Problems Addressed  Social Anxiety, Attention Deficit Disorder (ADD) - Adult   Goals 1. Interact socially without undue fear or anxiety.  Objective Identify, challenge, and replace biased, fearful self-talk with reality-based, positive self-talk. Target Date: 2022-12-19  Progress: 90   2. Sustain attention and concentration for consistently longer periods of  time.  Objective Learn and implement skills to reduce procrastination and avoidance. Target Date: 2022-12-19  Progress: 95   [name], PhD                                                                                                                               [name], PhD

## 2022-11-09 ENCOUNTER — Ambulatory Visit: Payer: BC Managed Care – PPO | Admitting: Psychology

## 2022-11-09 ENCOUNTER — Encounter: Payer: Self-pay | Admitting: Psychology

## 2022-11-09 DIAGNOSIS — F908 Attention-deficit hyperactivity disorder, other type: Secondary | ICD-10-CM

## 2022-11-09 DIAGNOSIS — F4010 Social phobia, unspecified: Secondary | ICD-10-CM | POA: Diagnosis not present

## 2022-11-09 NOTE — Progress Notes (Signed)
[facility] Counselor/Therapist Progress Note  Patient ID: Ray Park, MRN: [id number],    Date: 11/09/2022  Time Spent: 4:00 - 4:55 pm   Treatment Type: Individual Therapy  Met with patient for therapy session.  Patient was at the clinic and session was conducted from the therapist's office in person.    Reported Symptoms: Patient appears to have some processing deficits consistent with ADHD, and it is possible that he was able to compensate for attention problems in lower grades with high intelligence, but he is suffering more when greater organization and independence was required. 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 Recommendations include participating individual counseling to address these issues.   Patient has been going through much marital distress for the past three years.    Mental Status Exam: Appearance:  Casually dressed and neatly groomed      Behavior: Appropriate and Sharing  Motor: Normal  Speech/Language:  Clear and Coherent and Normal Rate  Affect: Full, appropriate  Mood: Euthymic  Thought process: normal  Thought content:   WNL  Sensory/Perceptual disturbances:   WNL  Orientation: oriented to person, place, time/date, and situation  Attention: Good  Concentration: Good  Memory: WNL  Fund of knowledge:  Good  Insight:   Good  Judgment:  Good  Impulse Control: Good   Risk Assessment: Danger to Self:  No Self-injurious Behavior: No Danger to Others: No Duty to Warn:no Physical Aggression / Violence:No  Access to Firearms a concern: No  Gang Involvement:No   Subjective: Patient stated that much of his concern lately had been focused on his sister moving in with his father which had patient worried his his sister taking advantage of his father, but was please about being  able to speak with his brother in law about the situation.  Marital relations were reported to be somewhat more comfortable as his wife has been sleeping better which makes her less stressed, although she blamed all of her stress on him because he does not follow or her rules or anticipate her needs.  Patient concerned that he is losing his identity trying to keep her from becoming upset.          Interventions: Living according to personal values was discussed, with emphasis on patient accepting that his wife's emotions are her responsibility and that he cannot always prevent her from becoming upset.  This way patient can act more authentically and rediscover his identity.  It would also force wife to adapt or get all the real issues out in the open.        Assessment: Patient realizing that he can no longer complete sublimate himself to appease his wife and will need to risk upsetting her to act more authentically.     Diagnosis:Social anxiety disorder  Attention deficit hyperactivity disorder (ADHD), other type  Plan: Regular therapy sessions to continue focusing on increasing awareness, assertiveness, and coping skills in order to lessen avoidance of difficult conversations. Wife may attend a session for a separate consult regarding patient.  Patient consented to this verbally.  Treatment plan was reviewed with patient/parents.  Patient/parents expressed agreement with the goals, objectives, and treatment methods identified in the treatment plan.    Treatment Plan Client Abilities/Strengths  Intelligence and thoughtful   Client Treatment Preferences  In person sessions   Client Statement of Needs  Patient  appears to have some processing deficits consistent with ADHD, and it is possible that he was able to compensate for attention problems in lower grades with high intelligence, but he is suffering more when greater organization and independence was required. 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 Recommendations include participating individual counseling to address these issues.   Problems Addressed  Social Anxiety, Attention Deficit Disorder (ADD) - Adult   Goals 1. Interact socially without undue fear or anxiety.  Objective Identify, challenge, and replace biased, fearful self-talk with reality-based, positive self-talk. Target Date: 2022-12-19  Progress: 95   2. Sustain attention and concentration for consistently longer periods of  time.  Objective Learn and implement skills to reduce procrastination and avoidance. Target Date: 2022-12-19  Progress: 95   [name], PhD                                                                                                                               [name], PhD               [name], PhD

## 2022-11-14 ENCOUNTER — Encounter: Payer: Self-pay | Admitting: Family Medicine

## 2022-11-15 NOTE — Telephone Encounter (Signed)
Last office visit 10/14/22 for ADD.  If okay to change back to 20 mg.  Please sign order below.

## 2022-11-17 MED ORDER — AMPHETAMINE-DEXTROAMPHET ER 20 MG PO CP24: 20.0000 mg | ORAL_CAPSULE | Freq: Every day | ORAL | 0 refills | Status: DC

## 2022-12-08 ENCOUNTER — Encounter: Payer: Self-pay | Admitting: Family Medicine

## 2022-12-08 ENCOUNTER — Ambulatory Visit: Payer: BC Managed Care – PPO | Admitting: Family Medicine

## 2022-12-08 VITALS — BP 122/70 | HR 69 | Temp 98.1°F | Ht 75.5 in | Wt 194.2 lb

## 2022-12-08 DIAGNOSIS — R41840 Attention and concentration deficit: Secondary | ICD-10-CM | POA: Diagnosis not present

## 2022-12-08 MED ORDER — AMPHETAMINE-DEXTROAMPHET ER 20 MG PO CP24: 20.0000 mg | ORAL_CAPSULE | Freq: Every day | ORAL | 0 refills | Status: DC

## 2022-12-08 NOTE — Progress Notes (Signed)
ADD  Since last OV.. he requested reduction in dose as he felt it did not help any more at higher dose and felt some associated headaches,  worsened focus. Compliant with meds: Adderall Xl 20 mg daily benefit from med (ie increase in concentration): change in mood: none change in appetite: none Insomnia: none tremor: none compliant with behavioral modification: Has been working on this issue.     12/08/2022    9:25 AM 07/14/2022   10:08 AM 05/26/2022    4:03 PM  Depression screen PHQ 2/9  Decreased Interest 0 0 0  Down, Depressed, Hopeless 0 0   PHQ - 2 Score 0 0 0      PMH and SH reviewed  ROS: Per HPI unless specifically indicated in ROS section    At home BP was down  to 120/70 BP Readings from Last 3 Encounters:  12/08/22 122/70  10/14/22 138/88  08/12/22 116/72    Body mass index is 23.96 kg/m.  Wt Readings from Last 3 Encounters:  12/08/22 194 lb 4 oz (88.1 kg)  10/14/22 196 lb 6.4 oz (89.1 kg)  08/12/22 198 lb (89.8 kg)    Meds, vitals, and allergies reviewed.   Blood pressure 122/70, pulse 69, temperature 98.1 F (36.7 C), temperature source Temporal, height 6' 3.5" (1.918 m), weight 194 lb 4 oz (88.1 kg), SpO2 100 %.   GEN: nad, alert and oriented, affect wnl and appropriate HEENT: mucous membranes moist NECK: supple w/o LA CV: rrr.  PULM: ctab, no inc wob ABD: soft, +bs EXT: no edema CN 2-12 wnl, s/s/dtr wnl x4.  No tremor.  Attention deficit Assessment & Plan: Chronic, questionable diagnosis Minimal improvement in areas of concern per both patient and wife. Side effects to Adderall XR 25 mg daily. Continue Adderall XR 20 mg daily He will continue counseling to work on marital issues and I encouraged him to speak with his wife about marriage counseling to improve their relationship.   Other orders -     Amphetamine-Dextroamphet ER; Take 1 capsule (20 mg total) by mouth daily.  Dispense: 30 capsule; Refill: 0 -     Amphetamine-Dextroamphet ER;  Take 1 capsule (20 mg total) by mouth daily.  Dispense: 30 capsule; Refill: 0 -     Amphetamine-Dextroamphet ER; Take 1 capsule (20 mg total) by mouth daily.  Dispense: 30 capsule; Refill: 0 -     Amphetamine-Dextroamphet ER; Take 1 capsule (20 mg total) by mouth daily.  Dispense: 30 capsule; Refill: 0

## 2022-12-08 NOTE — Assessment & Plan Note (Signed)
Chronic, questionable diagnosis Minimal improvement in areas of concern per both patient and wife. Side effects to Adderall XR 25 mg daily. Continue Adderall XR 20 mg daily He will continue counseling to work on marital issues and I encouraged him to speak with his wife about marriage counseling to improve their relationship.

## 2022-12-14 ENCOUNTER — Encounter: Payer: Self-pay | Admitting: Psychology

## 2022-12-14 ENCOUNTER — Ambulatory Visit: Payer: BC Managed Care – PPO | Admitting: Psychology

## 2022-12-14 DIAGNOSIS — F908 Attention-deficit hyperactivity disorder, other type: Secondary | ICD-10-CM

## 2022-12-14 DIAGNOSIS — F4010 Social phobia, unspecified: Secondary | ICD-10-CM | POA: Diagnosis not present

## 2022-12-14 NOTE — Progress Notes (Signed)
[facility] Counselor/Therapist Progress Note  Patient ID: Ray Park, MRN: [id number],    Date: 12/14/2022  Time Spent: 3:00 - 4:00 pm   Treatment Type: Individual Therapy  Met with patient for therapy session.  Patient was at the clinic and session was conducted from the therapist's office in person.    Reported Symptoms: Patient appears to have some processing deficits consistent with ADHD, and it is possible that he was able to compensate for attention problems in lower grades with high intelligence, but he is suffering more when greater organization and independence was required. 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 Recommendations include participating individual counseling to address these issues.   Patient has been going through much marital distress for the past three years.    Mental Status Exam: Appearance:  Casually dressed and neatly groomed      Behavior: Appropriate and Sharing  Motor: Normal  Speech/Language:  Clear and Coherent and Normal Rate  Affect: Full, appropriate  Mood: Euthymic  Thought process: normal  Thought content:   WNL  Sensory/Perceptual disturbances:   WNL  Orientation: oriented to person, place, time/date, and situation  Attention: Good  Concentration: Good  Memory: WNL  Fund of knowledge:  Good  Insight:   Good  Judgment:  Good  Impulse Control: Good   Risk Assessment: Danger to Self:  No Self-injurious Behavior: No Danger to Others: No Duty to Warn:no Physical Aggression / Violence:No  Access to Firearms a concern: No  Gang Involvement:No   Subjective: Patient stated that some of his concern about his sister has waned after she sold her house.  He still worries how she and his father will get along in the home.  moving in with his father which had patient  worried his his Marital relations were reported to be the same, coming to more of a realization that his wife is not going to change her behavior.  He stated that he often thinks about the relationship they had in the past as well as how it will turn out in the future in stead of focusing on the present relationship.  He sets boundaries with her when she becomes hostile or physical with him but not otherwise.          Interventions: Living in the present was discussed with emphasis on setting smaller short-term boundaries for how he wants to be treated (e.g., being upset with a situation without attacking him) and what he wants to do with his immediate life (e.g., seeing friends more often with or without her).    Assessment: Patient starting to set some boundaries and speak up for himself but will need to do it more often in less extreme circumstances.  He sees himself positively and is able to complete tasks more regularly but is often submissive for fear of  upsetting others.     Diagnosis:Social anxiety disorder  Attention deficit hyperactivity disorder (ADHD), other type  Plan: Regular therapy sessions to continue focusing on increasing awareness, assertiveness, and coping skills in order to lessen avoidance of difficult conversations. Wife may attend a session for a separate consult regarding patient.  Patient consented to this verbally.  Current objectives completed. New objectives to be written for next session.      Treatment plan was reviewed with patient/parents.  Patient/parents expressed agreement with the goals, objectives, and  treatment methods identified in the treatment plan.    Treatment Plan Client Abilities/Strengths  Intelligence and thoughtful   Client Treatment Preferences  In person sessions   Client Statement of Needs  Patient appears to have some processing deficits consistent with ADHD, and it is possible that he was able to compensate for attention problems in lower  grades with high intelligence, but he is suffering more when greater organization and independence was required. 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 Recommendations include participating individual counseling to address these issues.   Problems Addressed  Social Anxiety, Attention Deficit Disorder (ADD) - Adult   Goals 1. Interact socially without undue fear or anxiety.  Objective Identify, challenge, and replace biased, fearful self-talk with reality-based, positive self-talk. Target Date: 2022-12-19  Progress: 100  2. Sustain attention and concentration for consistently longer periods of  time.  Objective Learn and implement skills to reduce procrastination and avoidance. Target Date: 2022-12-19  Progress: 100   Ray Breitenstein, PhD                                                                                                                               [name], PhD               [name], PhD               [name], PhD

## 2023-01-07 ENCOUNTER — Ambulatory Visit: Payer: BC Managed Care – PPO | Admitting: Psychology

## 2023-01-07 ENCOUNTER — Encounter: Payer: Self-pay | Admitting: Psychology

## 2023-01-07 DIAGNOSIS — F908 Attention-deficit hyperactivity disorder, other type: Secondary | ICD-10-CM | POA: Diagnosis not present

## 2023-01-07 DIAGNOSIS — F4010 Social phobia, unspecified: Secondary | ICD-10-CM

## 2023-01-07 NOTE — Progress Notes (Signed)
[facility] Counselor/Therapist Progress Note  Patient ID: Ray Park, MRN: [id number],    Date: 01/07/2023  Time Spent: 2:00 - 2:50 pm   Treatment Type: Individual Therapy  Met with patient for therapy session.  Patient was at the clinic and session was conducted from the therapist's office in person.    Reported Symptoms: Patient appears to have some processing deficits consistent with ADHD, and it is possible that he was able to compensate for attention problems in lower grades with high intelligence, but he is suffering more when greater organization and independence was required. 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 Recommendations include participating individual counseling to address these issues.   Patient has been going through much marital distress for the past three years.    Mental Status Exam: Appearance:  Casually dressed and neatly groomed      Behavior: Appropriate and Sharing  Motor: Normal  Speech/Language:  Clear and Coherent and Normal Rate  Affect: Full, appropriate  Mood: Euthymic  Thought process: normal  Thought content:   WNL  Sensory/Perceptual disturbances:   WNL  Orientation: oriented to person, place, time/date, and situation  Attention: Good  Concentration: Good  Memory: WNL  Fund of knowledge:  Good  Insight:   Good  Judgment:  Good  Impulse Control: Good   Risk Assessment: Danger to Self:  No Self-injurious Behavior: No Danger to Others: No Duty to Warn:no Physical Aggression / Violence:No  Access to Firearms a concern: No  Gang Involvement:No   Subjective: Patient stated that his wife has been out of the house much of the time over the last couple of weeks, caring for her mother who has become ill.  This has allowed patient to be more independent and have more  control over daily activities at home.  He continues, however, to speak with her passively and reinforce her her excessive need for control when she is home or they speak on the phone.  He was able to express some his difficulties to a friend who empathized with him, but his recent independence has left hi wondering how he will feel when she returns and tries to retake control of the household.  Patient also stated that he believed that the Adderall was not helping him and having some unwanted side effects.           Interventions: Assertiveness was discussed in the form of stating intentions directly rather than always asking for permission and leaving the decision making up to her exclusively.    Assessment: Patient has been conditioned to speak passively to avoid overly hostile reactions from his spouse.  While he acknowledges the need to speak more assertively and directly he will need to make a conscious effort and prepare to do so otherwise he would like fall back on his default approach.  Some pushback by his spouse is to be expected.         Diagnosis:Social anxiety disorder  Attention deficit hyperactivity disorder (ADHD), other type  Plan: Regular therapy sessions to continue focusing on increasing awareness, assertiveness, and coping skills in order to lessen avoidance of difficult conversations. Wife may attend a session for a separate consult regarding patient.  Patient consented to this verbally.  Current objectives completed. New objectives to be written for next session.      Treatment plan was reviewed with  patient/parents.  Patient/parents expressed agreement with the goals, objectives, and treatment methods identified in the treatment plan.    Treatment Plan Client Abilities/Strengths  Intelligence and thoughtful   Client Treatment Preferences  In person sessions   Client Statement of Needs  Patient appears to have some processing deficits consistent with ADHD, and it is  possible that he was able to compensate for attention problems in lower grades with high intelligence, but he is suffering more when greater organization and independence was required. 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 Recommendations include participating individual counseling to address these issues.   Problems Addressed  Social Anxiety, Attention Deficit Disorder (ADD) - Adult   Goals 1. Interact socially without undue fear or anxiety.  Objective Patient to speak assertively in interactions with wife during 80% of the interactions. Target Date: 2023-12-19  Progress: 0   [name], PhD                                                                                                                                             [name], PhD                              [name], PhD

## 2023-01-21 ENCOUNTER — Encounter: Payer: Self-pay | Admitting: Psychology

## 2023-01-21 ENCOUNTER — Ambulatory Visit: Payer: BC Managed Care – PPO | Admitting: Psychology

## 2023-01-21 DIAGNOSIS — F908 Attention-deficit hyperactivity disorder, other type: Secondary | ICD-10-CM

## 2023-01-21 DIAGNOSIS — F4010 Social phobia, unspecified: Secondary | ICD-10-CM

## 2023-01-21 NOTE — Progress Notes (Signed)
[facility] Counselor/Therapist Progress Note  Patient ID: Ray Park, MRN: [id number],    Date: 01/21/2023  Time Spent: 4:00 - 4:50 pm   Treatment Type: Individual Therapy  Met with patient for therapy session.  Patient was at the clinic and session was conducted from the therapist's office in person.    Reported Symptoms: Patient appears to have some processing deficits consistent with ADHD, and it is possible that he was able to compensate for attention problems in lower grades with high intelligence, but he is suffering more when greater organization and independence was required. 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 Recommendations include participating individual counseling to address these issues.   Patient has been going through much marital distress for the past three years.    Mental Status Exam: Appearance:  Casually dressed and neatly groomed      Behavior: Appropriate and Sharing  Motor: Normal  Speech/Language:  Clear and Coherent and Normal Rate  Affect: Full, appropriate  Mood: Euthymic  Thought process: normal  Thought content:   WNL  Sensory/Perceptual disturbances:   WNL  Orientation: oriented to person, place, time/date, and situation  Attention: Good  Concentration: Good  Memory: WNL  Fund of knowledge:  Good  Insight:   Good  Judgment:  Good  Impulse Control: Good   Risk Assessment: Danger to Self:  No Self-injurious Behavior: No Danger to Others: No Duty to Warn:no Physical Aggression / Violence:No  Access to Firearms a concern: No  Gang Involvement:No   Subjective: Patient stated that his wife has continued to be out of the house much of the time over the last couple of weeks, caring for her mother who has become ill.  He has tried to speak more directly and  decisively, which has been helpful when he has, although he still gets criticized when he is less decisive and gets reminded of when he was indecisive in the past.  He expressed concerned about their upcoming trip to [place], hoping that something will not go wrong and ruin the trip.               Interventions: Commitment was discussed in the form of committing to being more decisive and speaking more assertively, with emphasis on how it may help their relationship if he does so.      Assessment: Patient believes what is perceived by others as lack of focus is really more related to his indecisiveness and not speaking directly, leading to communication problems and misinterpretation.      Diagnosis:Social anxiety disorder  Attention deficit hyperactivity disorder (ADHD), other type  Plan: Regular therapy sessions to continue focusing on increasing awareness, assertiveness, and coping skills in order to lessen avoidance of difficult conversations. Wife may attend a session for a separate consult regarding patient.  Patient consented to this verbally.    Treatment plan was reviewed with patient/parents.  Patient/parents expressed agreement with the goals, objectives, and treatment methods identified in the treatment plan.    Treatment Plan Client Abilities/Strengths  Intelligence and thoughtful   Client Treatment Preferences  In person sessions   Client Statement of Needs  Patient appears to have some processing deficits consistent with ADHD, and it is possible that he was able to compensate for attention problems in lower grades with high intelligence, but he is suffering more when greater organization and  independence was required. 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 Recommendations include participating individual counseling to  address these issues.   Problems Addressed  Social Anxiety, Attention Deficit Disorder (ADD) - Adult   Goals 1. Interact socially without undue fear or anxiety.  Objective Patient to speak assertively in interactions with wife during 80% of the interactions. Target Date: 2023-12-19  Progress: 10   [name], PhD                                                                                                                                             [name], PhD                              [name], PhD               [name], PhD

## 2023-01-26 DIAGNOSIS — H43812 Vitreous degeneration, left eye: Secondary | ICD-10-CM | POA: Diagnosis not present

## 2023-02-16 DIAGNOSIS — H43812 Vitreous degeneration, left eye: Secondary | ICD-10-CM | POA: Diagnosis not present

## 2023-02-18 ENCOUNTER — Ambulatory Visit: Payer: BC Managed Care – PPO | Admitting: Psychology

## 2023-03-11 ENCOUNTER — Encounter: Payer: Self-pay | Admitting: Psychology

## 2023-03-11 ENCOUNTER — Ambulatory Visit: Payer: BC Managed Care – PPO | Admitting: Psychology

## 2023-03-11 ENCOUNTER — Encounter: Payer: Self-pay | Admitting: Family Medicine

## 2023-03-11 DIAGNOSIS — F4010 Social phobia, unspecified: Secondary | ICD-10-CM

## 2023-03-11 NOTE — Progress Notes (Signed)
[facility] Counselor/Therapist Progress Note  Patient ID: Ray Park, MRN: [id number],    Date: 03/11/2023  Time Spent: 4:00 - 5:00 pm   Treatment Type: Individual Therapy  Met with patient for therapy session.  Patient was at the clinic and session was conducted from the therapist's office in person.    Reported Symptoms: Patient appears to have some processing deficits consistent with ADHD, and it is possible that he was able to compensate for attention problems in lower grades with high intelligence, but he is suffering more when greater organization and independence was required. 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 Recommendations include participating individual counseling to address these issues.   Patient has been going through much marital distress for the past three years.    Mental Status Exam: Appearance:  Casually dressed and neatly groomed      Behavior: Appropriate and Sharing  Motor: Normal  Speech/Language:  Clear and Coherent and Normal Rate  Affect: Full, appropriate  Mood: Euthymic  Thought process: normal  Thought content:   WNL  Sensory/Perceptual disturbances:   WNL  Orientation: oriented to person, place, time/date, and situation  Attention: Good  Concentration: Good  Memory: WNL  Fund of knowledge:  Good  Insight:   Good  Judgment:  Good  Impulse Control: Good   Risk Assessment: Danger to Self:  No Self-injurious Behavior: No Danger to Others: No Duty to Warn:no Physical Aggression / Violence:No  Access to Firearms a concern: No  Gang Involvement:No   Subjective: Patient stated that he and his wife had to cancel their cruise to [place], due to medical issues with her mother.  They had to cancel just before getting on the boat which was very disappointing.  While  patient has been more hesitant to discuss issues his concerns about his wife's behavior due to her high distress, he has been more committed to taking action that is important to him such as weaning off the Adderall, connecting more with friends, and taking a trip with his father.                Interventions: Focusing on what he can control was discussed in the form paying attention to what he can do to improve his life over trying to change how his wife acts, other than setting boundaries regarding her insults.        Assessment: Patient making more of a commitment to self care which can help balance out the stress from his wife's behavior.  He will need to do this more if he plans on staying in this marriage.    Diagnosis:Social anxiety disorder  Plan: Regular therapy sessions to continue focusing on increasing awareness, assertiveness, and coping skills in order to lessen avoidance of difficult conversations.   Treatment plan was reviewed with patient/parents.  Patient/parents expressed agreement with the goals, objectives, and treatment methods identified in the treatment plan.    Treatment Plan Client Abilities/Strengths  Intelligence and thoughtful   Client Treatment Preferences  In person sessions   Client Statement of Needs  Patient appears to have some processing deficits consistent with ADHD, and it is possible that he was able to compensate for attention problems in lower grades with high intelligence, but he is suffering more when greater organization and independence was required. 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 Recommendations include participating individual counseling to address these issues.   Problems Addressed  Social Anxiety, Attention Deficit Disorder (ADD) - Adult   Goals 1. Interact socially  without undue fear or anxiety.  Objective Patient to speak assertively in interactions with wife during 80% of the interactions. Target Date: 2023-12-19  Progress: 20   Avari Gelles, PhD                                                                                                                                             [name], PhD                              [name], PhD               [name], PhD               [name], PhD

## 2023-03-16 DIAGNOSIS — D224 Melanocytic nevi of scalp and neck: Secondary | ICD-10-CM | POA: Diagnosis not present

## 2023-03-16 DIAGNOSIS — L821 Other seborrheic keratosis: Secondary | ICD-10-CM | POA: Diagnosis not present

## 2023-03-16 DIAGNOSIS — L814 Other melanin hyperpigmentation: Secondary | ICD-10-CM | POA: Diagnosis not present

## 2023-03-22 DIAGNOSIS — H43812 Vitreous degeneration, left eye: Secondary | ICD-10-CM | POA: Diagnosis not present

## 2023-04-01 ENCOUNTER — Ambulatory Visit: Payer: BC Managed Care – PPO | Admitting: Psychology

## 2023-04-01 ENCOUNTER — Encounter: Payer: Self-pay | Admitting: Psychology

## 2023-04-01 DIAGNOSIS — F4010 Social phobia, unspecified: Secondary | ICD-10-CM | POA: Diagnosis not present

## 2023-04-01 NOTE — Progress Notes (Signed)
[facility] Counselor/Therapist Progress Note  Patient ID: Ray Park, MRN: [id number],    Date: 04/01/2023  Time Spent: 4:00 - 4:55 pm   Treatment Type: Individual Therapy  Met with patient for therapy session.  Patient was at the clinic and session was conducted from the therapist's office in person.    Reported Symptoms: Patient appears to have some processing deficits consistent with ADHD, and it is possible that he was able to compensate for attention problems in lower grades with high intelligence, but he is suffering more when greater organization and independence was required. 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 Recommendations include participating individual counseling to address these issues.   Patient has been going through much marital distress for the past five years.    Mental Status Exam: Appearance:  Casually dressed and neatly groomed      Behavior: Appropriate and Sharing  Motor: Normal  Speech/Language:  Clear and Coherent and Normal Rate  Affect: Full, appropriate  Mood: Euthymic  Thought process: normal  Thought content:   WNL  Sensory/Perceptual disturbances:   WNL  Orientation: oriented to person, place, time/date, and situation  Attention: Good  Concentration: Good  Memory: WNL  Fund of knowledge:  Good  Insight:   Good  Judgment:  Good  Impulse Control: Good   Risk Assessment: Danger to Self:  No Self-injurious Behavior: No Danger to Others: No Duty to Warn:no Physical Aggression / Violence:No  Access to Firearms a concern: No  Gang Involvement:No   Subjective: Patient stated that he told his wife that he had stopped taking the Adderall.  She was upset with him, but it did not change her mind.  He continues to connect more with friends, and had a good visit with his  father and sister.  He indicated becoming less upset by his wife's or there's reactions to him speaking more assertively, although he expressed concern for her as his assertiveness seems to be distressing her more.   .               Interventions: Behavior support strategies were discussed including using Shaping (gradually working toward getting desired result) and Extinction (not reinforcing unwanted behavior) when interacting with his wife.   Assessment: Patient continuing to progress in being more true to himself, while not progressing in the way his wife wants him to (carrying out her instructions as she wants him to) which will likely lead to increased tension between them, ultimately forcing a decision for either wife to change behavior or for patient to leave wife.    Diagnosis:Social anxiety disorder  Plan: Regular therapy sessions to continue focusing on increasing awareness, assertiveness, and coping skills in order to lessen avoidance of difficult conversations.   Treatment plan was reviewed with patient/parents.  Patient/parents expressed agreement with the goals, objectives, and treatment methods identified in the treatment plan.    Treatment Plan Client Abilities/Strengths  Intelligence and thoughtful   Client Treatment Preferences  In person sessions   Client Statement of Needs  Patient appears to have some processing deficits consistent with ADHD, and it is possible that he was able to compensate for attention problems in lower grades with high intelligence, but he is suffering more when greater organization and independence was required. 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 Recommendations include participating individual counseling to address these issues.   Problems Addressed  Social Anxiety, Attention Deficit  Disorder (ADD) - Adult   Goals 1. Interact socially without undue fear or anxiety.  Objective Patient to speak assertively in interactions with wife during 80% of the interactions. Target Date: 2023-12-19  Progress: 30   Earnest Mcgillis, PhD                                                                                                                                             [name], PhD                              [name], PhD               [name], PhD               [name], PhD               [name], PhD

## 2023-04-08 ENCOUNTER — Ambulatory Visit: Payer: BC Managed Care – PPO | Admitting: Family Medicine

## 2023-04-22 ENCOUNTER — Encounter: Payer: Self-pay | Admitting: Psychology

## 2023-04-22 ENCOUNTER — Ambulatory Visit: Payer: BC Managed Care – PPO | Admitting: Psychology

## 2023-04-22 DIAGNOSIS — F4010 Social phobia, unspecified: Secondary | ICD-10-CM

## 2023-04-22 NOTE — Progress Notes (Signed)
[facility] Counselor/Therapist Progress Note  Patient ID: Ray Park, MRN: [id number],    Date: 04/22/2023  Time Spent: 4:15 - 5:00 pm   Treatment Type: Individual Therapy  Met with patient for therapy session.  Patient was at the clinic and session was conducted from the therapist's office in person.    Reported Symptoms: Patient appears to have some processing deficits consistent with ADHD, and it is possible that he was able to compensate for attention problems in lower grades with high intelligence, but he is suffering more when greater organization and independence was required. 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 Recommendations include participating individual counseling to address these issues.   Patient has been going through much marital distress for the past five years.    Mental Status Exam: Appearance:  Casually dressed and neatly groomed      Behavior: Appropriate and Sharing  Motor: Normal  Speech/Language:  Clear and Coherent and Normal Rate  Affect: Full, appropriate  Mood: Euthymic  Thought process: normal  Thought content:   WNL  Sensory/Perceptual disturbances:   WNL  Orientation: oriented to person, place, time/date, and situation  Attention: Good  Concentration: Good  Memory: WNL  Fund of knowledge:  Good  Insight:   Good  Judgment:  Good  Impulse Control: Good   Risk Assessment: Danger to Self:  No Self-injurious Behavior: No Danger to Others: No Duty to Warn:no Physical Aggression / Violence:No  Access to Firearms a concern: No  Gang Involvement:No   Subjective: Patient stated that he has been setting boundaries with his wife more frequently, although he had been doing it gradually increasing firmness to avoid her getting too upset.  She was upset with him and still  blames him for any problems in their relationship (not working on his ADHD symptoms or being able to follow her strict routines).  She recently criticized him harshly for getting a smoothie because it was too expensive.  He reported feeling more comfortable about expressing himself but realized the relationship may not sustainable as his continues to grow emotional and she does not change how she treats him.          Interventions: Behavior support strategies were discussed including using Shaping (gradually working toward getting desired result) along with ACT strategies - allowing self to feel uncomfortable emotions in order to do important acts that will help his future.   Assessment: Patient continuing to progress in being more true to himself, while not progressing in the way his wife wants him to (carrying out her instructions as she wants him to) which will likely lead to increased tension between them, ultimately forcing a decision for either wife to change behavior or for patient to leave wife.    Diagnosis:Social anxiety disorder  Plan: Regular therapy sessions to continue focusing on increasing awareness, assertiveness, and coping skills in order to lessen avoidance of difficult conversations.   Treatment plan was reviewed with patient/parents.  Patient/parents expressed agreement with the goals, objectives, and treatment methods identified in the treatment plan.    Treatment Plan Client Abilities/Strengths  Intelligence and thoughtful   Client Treatment Preferences  In person sessions   Client Statement of Needs  Patient appears to have some processing deficits consistent with ADHD, and it is possible that he was able to compensate for attention problems in lower grades  with high intelligence, but he is suffering more when greater organization and independence was required. 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 Recommendations include participating individual counseling to address these issues.   Problems Addressed  Social Anxiety, Attention Deficit Disorder (ADD) - Adult   Goals 1. Interact socially without undue fear or anxiety.  Objective Patient to speak assertively in interactions with wife during 80% of the interactions. Target Date: 2023-12-19  Progress: -40   Maxx Calaway, PhD                                                                                                                                             [name], PhD                              [name], PhD               [name], PhD               [name], PhD               [name], PhD               [name], PhD

## 2023-05-10 ENCOUNTER — Ambulatory Visit: Payer: BC Managed Care – PPO | Admitting: Psychology

## 2023-06-03 ENCOUNTER — Ambulatory Visit: Payer: BC Managed Care – PPO | Admitting: Psychology

## 2023-06-03 ENCOUNTER — Encounter: Payer: Self-pay | Admitting: Psychology

## 2023-06-03 DIAGNOSIS — F908 Attention-deficit hyperactivity disorder, other type: Secondary | ICD-10-CM | POA: Diagnosis not present

## 2023-06-03 DIAGNOSIS — F4010 Social phobia, unspecified: Secondary | ICD-10-CM | POA: Diagnosis not present

## 2023-06-03 NOTE — Progress Notes (Signed)
[facility] Counselor/Therapist Progress Note  Patient ID: Ray Park, MRN: [id number],    Date: 06/03/2023  Time Spent: 4:00 - 5:00 pm   Treatment Type: Individual Therapy  Met with patient for therapy session.  Patient was at the clinic and session was conducted from the therapist's office in person.    Reported Symptoms: Patient appears to have some processing deficits consistent with ADHD, and it is possible that he was able to compensate for attention problems in lower grades with high intelligence, but he is suffering more when greater organization and independence was required. 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 Recommendations include participating individual counseling to address these issues.   Patient has been going through much marital distress for the past five years.    Mental Status Exam: Appearance:  Casually dressed and neatly groomed      Behavior: Appropriate and Sharing  Motor: Normal  Speech/Language:  Clear and Coherent and Normal Rate  Affect: Full, appropriate  Mood: Euthymic  Thought process: normal  Thought content:   WNL  Sensory/Perceptual disturbances:   WNL  Orientation: oriented to person, place, time/date, and situation  Attention: Good  Concentration: Good  Memory: WNL  Fund of knowledge:  Good  Insight:   Good  Judgment:  Good  Impulse Control: Good   Risk Assessment: Danger to Self:  No Self-injurious Behavior: No Danger to Others: No Duty to Warn:no Physical Aggression / Violence:No  Access to Firearms a concern: No  Gang Involvement:No   Subjective: Patient stated that he has been continued to set boundaries with his wife and others verbally, but has yet to back it up with action or consequences.  He indicated feeling very uncomfortable when others are  upset with him (e.g., wife giving him the silent treatment) that he ends up giving in or letting go of his boundaries in order to relieve the discomfort.  That identified as this being a core issue from a very young age.          Interventions: Behavior support strategies were discussed including using Shaping (gradually working toward getting desired result) along with ACT strategies - allowing self to feel uncomfortable emotions in order to do important acts that will help his future.    Assessment: Patient continuing to progress in understanding himself and his core issues better., it will take much conscious effort, commitment, awareness, and patience for patient to improve in this area since it has been so heavily conditioned.    Diagnosis:Social anxiety disorder  Attention deficit hyperactivity disorder (ADHD), other type  Plan: Regular therapy sessions to continue focusing on increasing awareness, assertiveness, and coping skills in order to lessen avoidance of difficult conversations.   Treatment plan was reviewed with patient/parents.  Patient/parents expressed agreement with the goals, objectives, and treatment methods identified in the treatment plan.    Treatment Plan Client Abilities/Strengths  Intelligence and thoughtful   Client Treatment Preferences  In person sessions   Client Statement of Needs  Patient appears to have some processing deficits consistent with ADHD, and it is possible that he was able to compensate for attention problems in lower grades with high intelligence, but he is suffering more when greater organization and independence was required. 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 Recommendations include participating individual counseling to address these issues.   Problems Addressed  Social  Anxiety, Attention Deficit Disorder (ADD) - Adult   Goals 1. Interact socially without undue fear or anxiety.  Objective Patient to speak assertively in interactions with wife during 80% of the interactions. Target Date: 2023-12-19  Progress: - 45   Mayra Brahm, PhD                                                                                                                                             [name], PhD                              [name], PhD               [name], PhD               [name], PhD               [name], PhD               [name], PhD               [name], PhD

## 2023-06-24 ENCOUNTER — Encounter: Payer: Self-pay | Admitting: Psychology

## 2023-06-24 ENCOUNTER — Ambulatory Visit: Payer: BC Managed Care – PPO | Admitting: Psychology

## 2023-06-24 DIAGNOSIS — F4010 Social phobia, unspecified: Secondary | ICD-10-CM | POA: Diagnosis not present

## 2023-06-24 NOTE — Progress Notes (Signed)
[facility] Counselor/Therapist Progress Note  Patient ID: Ray Park, MRN: [id number],    Date: 06/24/2023  Time Spent: 4:00 - 4:45 pm   Treatment Type: Individual Therapy  Met with patient for therapy session.  Patient was at the clinic and session was conducted from the therapist's office in person.    Reported Symptoms: Patient appears to have some processing deficits consistent with ADHD, and it is possible that he was able to compensate for attention problems in lower grades with high intelligence, but he is suffering more when greater organization and independence was required. 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 Recommendations include participating individual counseling to address these issues.   Patient has been going through much marital distress for the past five years.    Mental Status Exam: Appearance:  Neatly dressed and groomed      Behavior: Appropriate and Sharing  Motor: Normal  Speech/Language:  Clear and Coherent and Normal Rate  Affect: Full, appropriate  Mood: Euthymic  Thought process: normal  Thought content:   WNL  Sensory/Perceptual disturbances:   WNL  Orientation: oriented to person, place, time/date, and situation  Attention: Good  Concentration: Good  Memory: WNL  Fund of knowledge:  Good  Insight:   Good  Judgment:  Good  Impulse Control: Good   Risk Assessment: Danger to Self:  No Self-injurious Behavior: No Danger to Others: No Duty to Warn:no Physical Aggression / Violence:No  Access to Firearms a concern: No  Gang Involvement:No   Subjective: Patient stated that he struggles with initiating interaction with his wife, whether it is showing empathy or discussing feelings or concerns.  He said that he does this to avoid feelings difficult emotions which has  stemmed from childhood.  He still harbors some guilt over not being able to handle his mother's emotional volatility.  He indicated wanting to make more effort to initiating and expressing feelings without prompts from others.            Interventions: ACT and mindfulness strategies - forgiving his younger self and allowing self to feel uncomfortable emotions in order to initiate interaction, express feelings, and do important acts that will help his future.    Assessment: Patient continuing to progress in understanding himself and his core issues better, although he has been slow in having his insights turn into action, still fearing a negative response.      Diagnosis:Social anxiety disorder  Plan: Regular therapy sessions to continue focusing on increasing awareness, assertiveness, and coping skills in order to lessen avoidance of difficult conversations.   Treatment plan was reviewed with patient/parents.  Patient/parents expressed agreement with the goals, objectives, and treatment methods identified in the treatment plan.    Treatment Plan Client Abilities/Strengths  Intelligence and thoughtful   Client Treatment Preferences  In person sessions   Client Statement of Needs  Patient appears to have some processing deficits consistent with ADHD, and it is possible that he was able to compensate for attention problems in lower grades with high intelligence, but he is suffering more when greater organization and independence was required. 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 Recommendations include participating individual counseling to address these issues.   Problems Addressed  Social Anxiety, Attention Deficit Disorder (ADD) - Adult   Goals 1. Interact socially without undue fear or anxiety.  Objective Patient to speak  assertively in interactions with wife during 80% of the interactions. Target Date: 2023-12-19  Progress: - 50   Earle Burson, PhD                                                                                                                                             [name], PhD                              [name], PhD               [name], PhD               [name], PhD               [name], PhD               [name], PhD               [name], PhD               [name], PhD

## 2023-07-02 ENCOUNTER — Telehealth: Payer: Self-pay | Admitting: *Deleted

## 2023-07-02 DIAGNOSIS — Z8349 Family history of other endocrine, nutritional and metabolic diseases: Secondary | ICD-10-CM

## 2023-07-02 DIAGNOSIS — Z13220 Encounter for screening for lipoid disorders: Secondary | ICD-10-CM

## 2023-07-02 DIAGNOSIS — Z125 Encounter for screening for malignant neoplasm of prostate: Secondary | ICD-10-CM

## 2023-07-02 NOTE — Telephone Encounter (Signed)
-----   Message from Alvina Chou sent at 07/02/2023  9:07 AM EDT ----- Regarding: Lab orders for Mon, 10.7.24 Patient is scheduled for CPX labs, please order future labs, Thanks , Camelia Eng

## 2023-07-14 ENCOUNTER — Ambulatory Visit: Payer: BC Managed Care – PPO | Admitting: Psychology

## 2023-07-19 ENCOUNTER — Other Ambulatory Visit (INDEPENDENT_AMBULATORY_CARE_PROVIDER_SITE_OTHER): Payer: BC Managed Care – PPO

## 2023-07-19 ENCOUNTER — Other Ambulatory Visit: Payer: Self-pay

## 2023-07-19 DIAGNOSIS — Z8349 Family history of other endocrine, nutritional and metabolic diseases: Secondary | ICD-10-CM

## 2023-07-19 DIAGNOSIS — Z125 Encounter for screening for malignant neoplasm of prostate: Secondary | ICD-10-CM | POA: Diagnosis not present

## 2023-07-19 DIAGNOSIS — Z13220 Encounter for screening for lipoid disorders: Secondary | ICD-10-CM | POA: Diagnosis not present

## 2023-07-19 LAB — COMPREHENSIVE METABOLIC PANEL
ALT: 24 U/L (ref 0–53)
AST: 23 U/L (ref 0–37)
Albumin: 4.4 g/dL (ref 3.5–5.2)
Alkaline Phosphatase: 63 U/L (ref 39–117)
BUN: 18 mg/dL (ref 6–23)
CO2: 30 meq/L (ref 19–32)
Calcium: 9.2 mg/dL (ref 8.4–10.5)
Chloride: 105 meq/L (ref 96–112)
Creatinine, Ser: 1.2 mg/dL (ref 0.40–1.50)
GFR: 68.69 mL/min (ref >60.00)
Glucose, Bld: 98 mg/dL (ref 70–99)
Potassium: 3.9 meq/L (ref 3.5–5.1)
Sodium: 142 meq/L (ref 135–145)
Total Bilirubin: 0.5 mg/dL (ref 0.2–1.2)
Total Protein: 6.6 g/dL (ref 6.0–8.3)

## 2023-07-19 LAB — LIPID PANEL
Cholesterol: 164 mg/dL (ref 0–200)
HDL: 35.9 mg/dL — ABNORMAL LOW (ref >39.00)
LDL Cholesterol: 102 mg/dL — ABNORMAL HIGH (ref 0–99)
NonHDL: 128.06
Total CHOL/HDL Ratio: 5
Triglycerides: 129 mg/dL (ref 0.0–149.0)
VLDL: 25.8 mg/dL (ref 0.0–40.0)

## 2023-07-19 LAB — T3, FREE: T3, Free: 3 pg/mL (ref 2.3–4.2)

## 2023-07-19 LAB — T4, FREE: Free T4: 0.54 ng/dL — ABNORMAL LOW (ref 0.60–1.60)

## 2023-07-19 LAB — PSA: PSA: 1.41 ng/mL (ref 0.10–4.00)

## 2023-07-19 LAB — TSH: TSH: 3.4 u[IU]/mL (ref 0.35–5.50)

## 2023-07-19 MED ORDER — COMIRNATY 30 MCG/0.3ML IM SUSY
0.3000 mL | PREFILLED_SYRINGE | Freq: Once | INTRAMUSCULAR | 0 refills | Status: AC
  Filled 2023-07-19: qty 0.3, 1d supply, fill #0

## 2023-07-20 NOTE — Progress Notes (Signed)
No critical labs need to be addressed urgently. We will discuss labs in detail at upcoming office visit.   

## 2023-07-28 ENCOUNTER — Ambulatory Visit (INDEPENDENT_AMBULATORY_CARE_PROVIDER_SITE_OTHER): Payer: BC Managed Care – PPO | Admitting: Family Medicine

## 2023-07-28 ENCOUNTER — Encounter: Payer: Self-pay | Admitting: Family Medicine

## 2023-07-28 VITALS — BP 128/78 | HR 73 | Temp 98.1°F | Ht 75.89 in | Wt 198.0 lb

## 2023-07-28 DIAGNOSIS — R7989 Other specified abnormal findings of blood chemistry: Secondary | ICD-10-CM | POA: Insufficient documentation

## 2023-07-28 DIAGNOSIS — Z0000 Encounter for general adult medical examination without abnormal findings: Secondary | ICD-10-CM

## 2023-07-28 DIAGNOSIS — E789 Disorder of lipoprotein metabolism, unspecified: Secondary | ICD-10-CM | POA: Insufficient documentation

## 2023-07-28 DIAGNOSIS — R41840 Attention and concentration deficit: Secondary | ICD-10-CM

## 2023-07-28 NOTE — Addendum Note (Signed)
Addended byKerby Nora E on: 07/28/2023 11:09 AM   Modules accepted: Orders

## 2023-07-28 NOTE — Assessment & Plan Note (Signed)
Chronic, stable control off medication. Manufacturing engineer.

## 2023-07-28 NOTE — Patient Instructions (Signed)
Here is some info I have gathered for you for a trusted medical source. ?Lipid Management With Diet, Uptodate Feb 20.2021, Maple Mirza and [place] ? ?Although earlier, smaller trials suggested a benefit of garlic supplementation, a subsequent larger trial failed to demonstrate improvement in lipids with use of any of three different garlic preparations (raw, powdered, or aged). ? ?Bergamot: Improvements in serum lipids have been reported in trials of patients with metabolic syndrome, nonalcoholic fatty liver disease and in hyperlipidemic patients resistant to statin treatment. However, high-quality data on the effects of bergamot are lacking. ? ?Suggestions for you if you would like to try natural supplements to lower cholesterol. ? ?1.Souble fiber : Psyllium ?In a meta-analysis of randomized trials of patients with both normal and elevated cholesterol levels, the addition of 10.2 g/day of psyllium lowered the LDL cholesterol by an average of 12.8 mg/dL  ? ?2. Omega 3s: Mixed results in studies. Given you triglycerides are normal I would not use this. ? ?3.Red yeast rice ( 2.4 grams divided half in AM half in PM): Red yeast rice is a fermented rice product, most often taken as a supplement, which can improve serum cholesterol  via  method similar to prescription statins.  ?Red yeast rice supplements lowered total cholesterol (208 versus 251 mg/dL) and LDL cholesterol (829 versus 175 mg/dL) compared with placebo. ? ?4. Plant sterol.. There are naturally occurring sterols and stanols in nuts, legumes, whole grains, fruits, vegetables, and plant oils. In addition, a number of manufactured products enriched with plant sterols and stanols are commercially available. The margarines containing these compounds (eg, Benecol and Take Control spreads) have been available the longest and are the most studied  ?In a trial of 150 patients with mild hypercholesterolemia,those consuming the fortified margarine experienced a 10 to 14  percent decrease in total cholesterol and LDL cholesterol. ? ?5.Green Tea Catechins: .n a year-long randomized trial of more than 900 healthy postmenopausal women, green tea catechin supplements (1315 mg catechins/day) reduced total cholesterol and LDL cholesterol, increased triglycerides, and had no effect on HDL cholesterol  ? ?

## 2023-07-28 NOTE — Progress Notes (Addendum)
Patient ID: Ray Park, male    DOB: 1970-12-25, 52 y.o.   MRN: [id number]  This visit was conducted in person.  BP 128/78 (BP Location: Left Arm, Patient Position: Sitting, Cuff Size: Normal)   Pulse 73   Temp 98.1 F (36.7 C) (Oral)   Ht 6' 3.89" (1.928 m)   Wt 198 lb (89.8 kg)   SpO2 98%   BMI 24.17 kg/m    CC:  Chief Complaint  Patient presents with   Annual Exam    Subjective:   HPI: Ray Park is a 52 y.o. male presenting on 07/28/2023 for Annual Exam  Reviewed labs with patient in detail No thyroid disease.   Diet: heart healthy diet  Exercise: walking, lifting at work.  Water intake, good  Body mass index is 24.17 kg/m. Wt Readings from Last 3 Encounters:  07/28/23 198 lb (89.8 kg)  12/08/22 194 lb 4 oz (88.1 kg)  10/14/22 196 lb 6.4 oz (89.1 kg)    Lab Results  Component Value Date   CHOL 164 07/19/2023   HDL 35.90 (L) 07/19/2023   LDLCALC 102 (H) 07/19/2023   LDLDIRECT 95.0 11/20/2014   TRIG 129.0 07/19/2023   CHOLHDL 5 07/19/2023   The 10-year ASCVD risk score (Arnett DK, et al., 2019) is: 5.4%   Values used to calculate the score:     Age: 6 years     Sex: Male     Is Non-Hispanic African American: No     Diabetic: No     Tobacco smoker: No     Systolic Blood Pressure: 128 mmHg     Is BP treated: No     HDL Cholesterol: 35.9 mg/dL     Total Cholesterol: 164 mg/dL  ADD, minimal symptoms: no longer on Adderall as not helpful.  Going to counselor.. monthly.     07/28/2023   10:17 AM 12/08/2022    9:25 AM 07/14/2022   10:08 AM  Depression screen PHQ 2/9  Decreased Interest 0 0 0  Down, Depressed, Hopeless 0 0 0  PHQ - 2 Score 0 0 0  Altered sleeping 0    Tired, decreased energy 0    Change in appetite 0    Feeling bad or failure about yourself  0    Trouble concentrating 0    Moving slowly or fidgety/restless 0    Suicidal thoughts 0    PHQ-9 Score 0    Difficult doing work/chores Not difficult at all         07/28/2023   10:18 AM  GAD 7 : Generalized Anxiety Score  Nervous, Anxious, on Edge 1  Control/stop worrying 0  Worry too much - different things 0  Trouble relaxing 0  Restless 0  Easily annoyed or irritable 0  Afraid - awful might happen 0  Total GAD 7 Score 1  Anxiety Difficulty Not difficult at all      Relevant past medical, surgical, family and social history reviewed and updated as indicated. Interim medical history since our last visit reviewed. Allergies and medications reviewed and updated. No outpatient medications prior to visit.   No facility-administered medications prior to visit.     Per HPI unless specifically indicated in ROS section below Review of Systems  Constitutional:  Negative for fatigue and fever.  HENT:  Negative for ear pain.   Eyes:  Negative for pain.  Respiratory:  Negative for cough and shortness of breath.   Cardiovascular:  Negative for chest pain,  palpitations and leg swelling.  Gastrointestinal:  Positive for constipation. Negative for abdominal pain.  Genitourinary:  Negative for dysuria.  Musculoskeletal:  Negative for arthralgias.  Neurological:  Negative for syncope, light-headedness and headaches.  Psychiatric/Behavioral:  Negative for dysphoric mood.    Objective:  BP 128/78 (BP Location: Left Arm, Patient Position: Sitting, Cuff Size: Normal)   Pulse 73   Temp 98.1 F (36.7 C) (Oral)   Ht 6' 3.89" (1.928 m)   Wt 198 lb (89.8 kg)   SpO2 98%   BMI 24.17 kg/m   Wt Readings from Last 3 Encounters:  07/28/23 198 lb (89.8 kg)  12/08/22 194 lb 4 oz (88.1 kg)  10/14/22 196 lb 6.4 oz (89.1 kg)    BP Readings from Last 3 Encounters:  07/28/23 128/78  12/08/22 122/70  10/14/22 138/88          Physical Exam Constitutional:      Appearance: He is well-developed.  HENT:     Head: Normocephalic.     Right Ear: Hearing normal.     Left Ear: Hearing normal.     Nose: Nose normal.  Neck:     Thyroid: No thyroid mass or  thyromegaly.     Vascular: No carotid bruit.     Trachea: Trachea normal.  Cardiovascular:     Rate and Rhythm: Normal rate and regular rhythm.     Pulses: Normal pulses.     Heart sounds: Heart sounds not distant. No murmur heard.    No friction rub. No gallop.     Comments: No peripheral edema Pulmonary:     Effort: Pulmonary effort is normal. No respiratory distress.     Breath sounds: Normal breath sounds.  Skin:    General: Skin is warm and dry.     Findings: No rash.  Psychiatric:        Speech: Speech normal.        Behavior: Behavior normal.        Thought Content: Thought content normal.  Results for orders placed or performed in visit on 07/19/23  TSH  Result Value Ref Range   TSH 3.40 0.35 - 5.50 uIU/mL  T4, free  Result Value Ref Range   Free T4 0.54 (L) 0.60 - 1.60 ng/dL  T3, free  Result Value Ref Range   T3, Free 3.0 2.3 - 4.2 pg/mL  PSA  Result Value Ref Range   PSA 1.41 0.10 - 4.00 ng/mL  Lipid panel  Result Value Ref Range   Cholesterol 164 0 - 200 mg/dL   Triglycerides 696.2 0.0 - 149.0 mg/dL   HDL 95.28 (L) >41.32 mg/dL   VLDL 44.0 0.0 - 10.2 mg/dL   LDL Cholesterol 725 (H) 0 - 99 mg/dL   Total CHOL/HDL Ratio 5    NonHDL 128.06   Comprehensive metabolic panel  Result Value Ref Range   Sodium 142 135 - 145 mEq/L   Potassium 3.9 3.5 - 5.1 mEq/L   Chloride 105 96 - 112 mEq/L   CO2 30 19 - 32 mEq/L   Glucose, Bld 98 70 - 99 mg/dL   BUN 18 6 - 23 mg/dL   Creatinine, Ser 3.66 0.40 - 1.50 mg/dL   Total Bilirubin 0.5 0.2 - 1.2 mg/dL   Alkaline Phosphatase 63 39 - 117 U/L   AST 23 0 - 37 U/L   ALT 24 0 - 53 U/L   Total Protein 6.6 6.0 - 8.3 g/dL   Albumin 4.4 3.5 - 5.2  g/dL   GFR 81.19 >14.78 mL/min   Calcium 9.2 8.4 - 10.5 mg/dL     COVID 19 screen:  No recent travel or known exposure to COVID19 The patient denies respiratory symptoms of COVID 19 at this time. The importance of social distancing was discussed today.   Assessment and  Plan   The patient's preventative maintenance and recommended screening tests for an annual wellness exam were reviewed in full today. Brought up to date unless services declined.  Counselled on the importance of diet, exercise, and its role in overall health and mortality. The patient's FH and SH was reviewed, including their home life, tobacco status, and drug and alcohol status.   Vaccines: Uptodate with COVID19 x 4,  Td , flu  and shingrix uptodate  Prostate Cancer Screen:  Lab Results  Component Value Date   PSA 1.41 07/19/2023   PSA 0.92 07/08/2022   PSA 0.86 07/03/2021  Colon Cancer Screen:  05/19/21 plan repeat in 5 years      Smoking Status: none ETOH/ drug use:  Every few weeks/none  HIV screen:  none Hep C: done Problem List Items Addressed This Visit     Abnormal serum thyroxine (T4) level   Relevant Orders   TSH   T4, free   T3, free   Attention deficit     Chronic, stable control off medication. Manufacturing engineer.      Borderline high cholesterol    Encouraged working on increased physical activity and heart healthy diet.  Increase vegetable fats in place of animal fats. 5% 10-year ASCVD risk score.      Other Visit Diagnoses     Routine general medical examination at a health care facility    -  Primary           Kerby Nora, MD

## 2023-07-28 NOTE — Assessment & Plan Note (Signed)
Encouraged working on increased physical activity and heart healthy diet.  Increase vegetable fats in place of animal fats. 5% 10-year ASCVD risk score.

## 2023-08-18 ENCOUNTER — Ambulatory Visit: Payer: BC Managed Care – PPO | Admitting: Psychology

## 2023-08-18 ENCOUNTER — Encounter: Payer: Self-pay | Admitting: Psychology

## 2023-08-18 DIAGNOSIS — F4010 Social phobia, unspecified: Secondary | ICD-10-CM | POA: Diagnosis not present

## 2023-08-18 NOTE — Progress Notes (Signed)
[facility] Counselor/Therapist Progress Note  Patient ID: Ray Park, MRN: [id number],    Date: 08/18/2023  Time Spent: 4:00 - 4:55 pm   Treatment Type: Individual Therapy  Met with patient for therapy session.  Patient was at the clinic and session was conducted from the therapist's office in person.    Reported Symptoms: Patient appears to have some processing deficits consistent with ADHD, and it is possible that he was able to compensate for attention problems in lower grades with high intelligence, but he is suffering more when greater organization and independence was required. 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 Recommendations include participating individual counseling to address these issues.   Patient has been going through much marital distress for the past five years.    Mental Status Exam: Appearance:  Neatly dressed and groomed      Behavior: Appropriate and Sharing  Motor: Normal  Speech/Language:  Clear and Coherent and Normal Rate  Affect: Full, appropriate  Mood: Frustrated  Thought process: normal  Thought content:   WNL  Sensory/Perceptual disturbances:   WNL  Orientation: oriented to person, place, time/date, and situation  Attention: Good  Concentration: Good  Memory: WNL  Fund of knowledge:  Good  Insight:   Good  Judgment:  Good  Impulse Control: Good   Risk Assessment: Danger to Self:  No Self-injurious Behavior: No Danger to Others: No Duty to Warn:no Physical Aggression / Violence:No  Access to Firearms a concern: No  Gang Involvement:No   Subjective: Patient stated that he has been initiating interaction with his wife more, but is has had little effect on her relationship.  He stated that wife seems depressed and more irritable, suggesting that he find his own  apartment due to him breaking a house rule.    He said that he is still trying to find an effective way to address his concerns with her.           Interventions: ACT and mindfulness strategies - committing to speaking honestly despite his anxiety or worries about a negative response.  Emphasis was on believing that he is now an adult and can handle his own feelings as well as make and follow his own rules sometimes.        Assessment: Patient continuing to avoid having honest conversations with his wife and enabling her to insist on excessive control of the household and their relationship.  He will need to break this pattern otherwise the resentment can build to unbearable levels.       Diagnosis:Social anxiety disorder  Plan: Regular therapy sessions to continue focusing on increasing awareness, assertiveness, and coping skills in order to lessen avoidance of difficult conversations.   Treatment plan was reviewed with patient/parents.  Patient/parents expressed agreement with the goals, objectives, and treatment methods identified in the treatment plan.    Treatment Plan Client Abilities/Strengths  Intelligence and thoughtful   Client Treatment Preferences  In person sessions   Client Statement of Needs  Patient appears to have some processing deficits consistent with ADHD, and it is possible that he was able to compensate for attention problems in lower grades with high intelligence, but he is suffering more when greater organization and independence was required. 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 Recommendations include participating individual counseling to address these issues.   Problems Addressed  Social Anxiety, Attention Deficit Disorder (ADD) - Adult   Goals 1. Interact socially without undue fear or  anxiety.  Objective Patient to speak assertively in interactions with wife during 80% of the interactions. Target Date: 2023-12-19  Progress: - 55   [name], PhD                                                                                                                                                                [name], PhD

## 2023-08-25 ENCOUNTER — Ambulatory Visit: Payer: BC Managed Care – PPO | Admitting: Psychology

## 2023-09-08 ENCOUNTER — Other Ambulatory Visit (INDEPENDENT_AMBULATORY_CARE_PROVIDER_SITE_OTHER): Payer: BC Managed Care – PPO

## 2023-09-08 DIAGNOSIS — R946 Abnormal results of thyroid function studies: Secondary | ICD-10-CM

## 2023-09-08 DIAGNOSIS — R7989 Other specified abnormal findings of blood chemistry: Secondary | ICD-10-CM

## 2023-09-08 LAB — TSH: TSH: 2.55 u[IU]/mL (ref 0.35–5.50)

## 2023-09-08 LAB — T4, FREE: Free T4: 0.65 ng/dL (ref 0.60–1.60)

## 2023-09-08 LAB — T3, FREE: T3, Free: 3.6 pg/mL (ref 2.3–4.2)

## 2023-09-15 ENCOUNTER — Encounter: Payer: Self-pay | Admitting: Psychology

## 2023-09-15 ENCOUNTER — Ambulatory Visit: Payer: BC Managed Care – PPO | Admitting: Psychology

## 2023-09-15 DIAGNOSIS — F4010 Social phobia, unspecified: Secondary | ICD-10-CM

## 2023-09-15 NOTE — Progress Notes (Signed)
[facility] Counselor/Therapist Progress Note  Patient ID: Ray Park, MRN: [id number],    Date: 09/15/2023  Time Spent: 10:40 - 11:35 am   Treatment Type: Individual Therapy  Met with patient for therapy session.  Patient was at the clinic and session was conducted from the therapist's office in person.    Reported Symptoms: Patient appears to have some processing deficits consistent with ADHD, and it is possible that he was able to compensate for attention problems in lower grades with high intelligence, but he is suffering more when greater organization and independence was required. 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 Recommendations include participating individual counseling to address these issues.   Patient has been going through much marital distress for the past five years with continue problems discussed this session.    Mental Status Exam: Appearance:  Neatly dressed and groomed      Behavior: Appropriate and Sharing  Motor: Normal  Speech/Language:  Clear and Coherent and Normal Rate  Affect: Full, appropriate  Mood: Anxious, confused  Thought process: normal  Thought content:   WNL  Sensory/Perceptual disturbances:   WNL  Orientation: oriented to person, place, time/date, and situation  Attention: Good  Concentration: Good  Memory: WNL  Fund of knowledge:  Good  Insight:   Good  Judgment:  Good  Impulse Control: Good   Risk Assessment: Danger to Self:  No Self-injurious Behavior: No Danger to Others: No Duty to Warn:no Physical Aggression / Violence:No  Access to Firearms a concern: No  Gang Involvement:No   Subjective: Patient stated that he and his wife had more intense altercations since the last session after she discovered that he discussed his concerns with a close friend  of his who is male.  Patient ended up breaking down emotionally and visiting his father for the weekend, but still having the automatic reaction of avoiding honesty and being overly placating to her.  During the session, it became clearer to him that he gets most upset about her repeating his past mistakes excessively and being disrespectful to him through frequent verbal berating.            Interventions: ACT and mindfulness strategies - committing to speaking honestly despite his anxiety or worries about a negative response.  Emphasis was on thinking about nonnegotiable behavior for how he wants to be treated along with thinking about these ahead of time so he will be able to respond in the moment when she treats him disrespectfully.          Assessment: Patient continuing to avoid having honest conversations with his wife which is leading to increased berating as she is catching him with his avoidance of his true feelings.  Part of the problem is that she does not acknowledge his feelings when he does speak honestly so that will need to be addressed as well.          Diagnosis:Social anxiety disorder  Plan: Regular therapy sessions to continue focusing on increasing awareness, assertiveness, and coping skills in order to lessen avoidance of difficult conversations.   Treatment plan was reviewed with patient/parents.  Patient/parents expressed agreement with the goals, objectives, and treatment methods identified in the treatment plan.    Treatment Plan Client Abilities/Strengths  Intelligence and thoughtful   Client Treatment Preferences  In person sessions   Client Statement of  Needs  Patient appears to have some processing deficits consistent with ADHD, and it is possible that he was able to compensate for attention problems in lower grades with high intelligence, but he is suffering more when greater organization and independence was required. 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 Recommendations include participating individual counseling to address these issues.   Problems Addressed  Social Anxiety, Attention Deficit Disorder (ADD) - Adult   Goals 1. Interact socially without undue fear or anxiety.  Objective Patient to speak assertively in interactions with wife during 80% of the interactions. Target Date: 2023-12-19  Progress: - 60   [name], PhD                                                                                                                                                                               [name], PhD

## 2023-10-12 ENCOUNTER — Encounter: Payer: Self-pay | Admitting: Family Medicine

## 2023-10-20 ENCOUNTER — Encounter: Payer: Self-pay | Admitting: Family Medicine

## 2023-10-20 ENCOUNTER — Ambulatory Visit: Payer: BC Managed Care – PPO | Admitting: Family Medicine

## 2023-10-20 VITALS — BP 114/64 | HR 67 | Temp 98.5°F | Ht 75.89 in | Wt 204.2 lb

## 2023-10-20 DIAGNOSIS — R42 Dizziness and giddiness: Secondary | ICD-10-CM | POA: Insufficient documentation

## 2023-10-20 DIAGNOSIS — R9431 Abnormal electrocardiogram [ECG] [EKG]: Secondary | ICD-10-CM | POA: Diagnosis not present

## 2023-10-20 DIAGNOSIS — I959 Hypotension, unspecified: Secondary | ICD-10-CM

## 2023-10-20 DIAGNOSIS — R001 Bradycardia, unspecified: Secondary | ICD-10-CM | POA: Insufficient documentation

## 2023-10-20 NOTE — Progress Notes (Signed)
 Patient ID: Ray Park, male    DOB: 1971-04-26, 53 y.o.   MRN: [id number]  This visit was conducted in person.  BP 114/64   Pulse 67   Temp 98.5 F (36.9 C) (Oral)   Ht 6' 3.89 (1.928 m)   Wt 204 lb 3.2 oz (92.6 kg)   SpO2 95%   BMI 24.93 kg/m    CC:  Chief Complaint  Patient presents with   Dizziness    Episode occurred on Christmas Eve. Patient states that it occurs maybe once every few years.     Subjective:   HPI: Ray Park is a 53 y.o. male presenting on 10/20/2023 for Dizziness (Episode occurred on Christmas Eve. Patient states that it occurs maybe once every few years. )  For the past few years ( worst in 2017), Ray Park notes less than  once a year that he has an episode of dizziness, nausea and low blood pressure/low heart rate. In past has also felt tremulous Typically craves crackers ginger ale and feels better after eating, drinking and lying down for 30 minutes.  No clear triggers.  Maybe mild SOB, no palpitation, o chest pain.  Patient describes episode this year... Blood pressure was definitely low, and rebounded after eating the crackers. Here are the numbers...  94/68 - 57pulse, initially 10 AM... had eaten breakfast 1.5 hours before ( usual coffee, bagel banana) 102/69 - 52p 105/72 - 53p 114/68 -54p 123/71 - 64p     No new meds. No ETOH, nml water intake.  No associated anxiety panic. BP Readings from Last 3 Encounters:  10/20/23 114/64  07/28/23 128/78  12/08/22 122/70    Normal lab eval with CMET and TSH in   Has history of mitral valve prolapse.  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 No previous for comparison   Relevant past medical, surgical, family and social history reviewed and updated as indicated. Interim medical history since our last visit reviewed. Allergies and medications reviewed and updated. No outpatient medications prior to visit.   No  facility-administered medications prior to visit.     Per HPI unless specifically indicated in ROS section below Review of Systems  Constitutional:  Negative for fatigue and fever.  HENT:  Negative for ear pain.   Eyes:  Negative for pain.  Respiratory:  Negative for cough and shortness of breath.   Cardiovascular:  Negative for chest pain, palpitations and leg swelling.  Gastrointestinal:  Negative for abdominal pain.  Genitourinary:  Negative for dysuria.  Musculoskeletal:  Negative for arthralgias.  Neurological:  Negative for syncope, light-headedness and headaches.  Psychiatric/Behavioral:  Negative for dysphoric mood.    Objective:  BP 114/64   Pulse 67   Temp 98.5 F (36.9 C) (Oral)   Ht 6' 3.89 (1.928 m)   Wt 204 lb 3.2 oz (92.6 kg)   SpO2 95%   BMI 24.93 kg/m   Wt Readings from Last 3 Encounters:  10/20/23 204 lb 3.2 oz (92.6 kg)  07/28/23 198 lb (89.8 kg)  12/08/22 194 lb 4 oz (88.1 kg)      Physical Exam Vitals reviewed.  Constitutional:      Appearance: He is well-developed.  HENT:     Head: Normocephalic.     Right Ear: Hearing normal.     Left Ear: Hearing normal.     Nose: Nose normal.  Neck:     Thyroid : No thyroid  mass or thyromegaly.  Vascular: No carotid bruit.     Trachea: Trachea normal.  Cardiovascular:     Rate and Rhythm: Normal rate and regular rhythm.     Pulses: Normal pulses.     Heart sounds: Heart sounds not distant. No murmur heard.    No friction rub. No gallop.     Comments: No peripheral edema Pulmonary:     Effort: Pulmonary effort is normal. No respiratory distress.     Breath sounds: Normal breath sounds.  Skin:    General: Skin is warm and dry.     Findings: No rash.  Psychiatric:        Speech: Speech normal.        Behavior: Behavior normal.        Thought Content: Thought content normal.       Results for orders placed or performed in visit on 09/08/23  T3, free   Collection Time: 09/08/23  8:01 AM  Result  Value Ref Range   T3, Free 3.6 2.3 - 4.2 pg/mL  T4, free   Collection Time: 09/08/23  8:01 AM  Result Value Ref Range   Free T4 0.65 0.60 - 1.60 ng/dL  TSH   Collection Time: 09/08/23  8:01 AM  Result Value Ref Range   TSH 2.55 0.35 - 5.50 uIU/mL    Assessment and Plan  Dizziness Assessment & Plan: Chronic, intermittent, approximately yearly spells of lightheadedness.  This time patient was able to check blood pressure and heart rate and found to be somewhat hypotensive and bradycardic. Patient had eaten within an hour and a half of spell so does not appear to be hypoglycemia.  Was a normal meal for him.  Good water intake and patient feeling well otherwise.  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  No previous EKG for comparison.  Will refer to cardiology for consideration of monitor versus other recommendation although spells are not happening frequently.  Recent lab evaluation in fall 2024 was within the normal range including complete metabolic panel, thyroid  and CBC.  Normal glucose..   Orders: -     Ambulatory referral to Cardiology  Hypotension, unspecified hypotension type -     Ambulatory referral to Cardiology  Bradycardia -     EKG 12-Lead -     Ambulatory referral to Cardiology  Abnormal EKG -     Ambulatory referral to Cardiology    No follow-ups on file.   Greig Ring, MD

## 2023-10-20 NOTE — Assessment & Plan Note (Signed)
 Chronic, intermittent, approximately yearly spells of lightheadedness.  This time patient was able to check blood pressure and heart rate and found to be somewhat hypotensive and bradycardic. Patient had eaten within an hour and a half of spell so does not appear to be hypoglycemia.  Was a normal meal for him.  Good water intake and patient feeling well otherwise.  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  No previous EKG for comparison.  Will refer to cardiology for consideration of monitor versus other recommendation although spells are not happening frequently.  Recent lab evaluation in fall 2024 was within the normal range including complete metabolic panel, thyroid  and CBC.  Normal glucose.SABRA

## 2023-10-28 ENCOUNTER — Ambulatory Visit: Payer: BC Managed Care – PPO | Admitting: Psychology

## 2023-10-28 ENCOUNTER — Encounter: Payer: Self-pay | Admitting: Psychology

## 2023-10-28 DIAGNOSIS — F418 Other specified anxiety disorders: Secondary | ICD-10-CM | POA: Diagnosis not present

## 2023-10-28 DIAGNOSIS — F4010 Social phobia, unspecified: Secondary | ICD-10-CM

## 2023-10-28 NOTE — Progress Notes (Signed)
[facility] Counselor/Therapist Progress Note  Patient ID: Ray Park, MRN: [id number],    Date: 10/28/2023  Time Spent: 4:00 - 5:00 pm   Treatment Type: Individual Therapy  Met with patient for therapy session.  Patient was at the clinic and session was conducted from the therapist's office in person.    Reported Symptoms: Patient appears to have some processing deficits consistent with ADHD, and it is possible that he was able to compensate for attention problems in lower grades with high intelligence, but he is suffering more when greater organization and independence was required. 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 Recommendations include participating individual counseling to address these issues.   Patient has been going through much marital distress for the past five years with continued problems discussed this session.    Mental Status Exam: Appearance:  Neatly dressed and groomed      Behavior: Appropriate and Sharing  Motor: Normal  Speech/Language:  Clear and Coherent and Normal Rate  Affect: Full, appropriate  Mood: Frustrated  Thought process: normal  Thought content:   WNL  Sensory/Perceptual disturbances:   WNL  Orientation: oriented to person, place, time/date, and situation  Attention: Good  Concentration: Good  Memory: WNL  Fund of knowledge:  Good  Insight:   Good  Judgment:  Good  Impulse Control: Good   Risk Assessment: Danger to Self:  No Self-injurious Behavior: No Danger to Others: No Duty to Warn:no Physical Aggression / Violence:No  Access to Firearms a concern: No  Gang Involvement:No   Subjective: Patient stated that his wife has been less hostile toward him since the last session, but still insulting and trying to control him.  He mentioned starting to assert  his boundaries but is not doing so consistently.  He mentioned looking at his life as a whole and noticing there he had trouble relationship outside of his marriage.  He wants to be able consistently assert himself before deciding whether stay or leave his marriage.                    Interventions: ACT and mindfulness strategies - shifting the focus of his distress from his wife's behavior to his, understanding that his behavior (how he responds to her and asserts his boundaries/needs) is the only part that he can control.           Assessment: Patient continuing to put the focus of his emotional well being on his wife where it really needs to be directed toward himself, so he can be in a better spot emotionally to decide his future actions.   Diagnosis:Social anxiety disorder  Plan: Regular therapy sessions to continue focusing on increasing awareness, assertiveness, and coping skills in order to lessen avoidance of difficult conversations.   Treatment plan was reviewed with patient/parents.  Patient/parents expressed agreement with the goals, objectives, and treatment methods identified in the treatment plan.    Treatment Plan Client Abilities/Strengths  Intelligence and thoughtful   Client Treatment Preferences  In person sessions   Client Statement of Needs  Patient appears to have some processing deficits consistent with ADHD, and it is possible that he was able to compensate for attention problems in lower grades with high intelligence, but he is suffering more when greater organization and independence was required. 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 Recommendations include participating individual counseling to address these issues.   Problems Addressed  Social Anxiety, Attention Deficit Disorder (ADD) - Adult    Goals 1. Interact socially without undue fear or anxiety.  Objective Patient to speak assertively in interactions with wife during 80% of the interactions. Target Date: 2023-12-19  Progress: - 70   Ray Stolze, PhD                                                                                                                                                                               Ray Dames, PhD               Ray Dames, PhD

## 2023-11-25 ENCOUNTER — Ambulatory Visit: Payer: BC Managed Care – PPO | Admitting: Psychology

## 2023-11-25 ENCOUNTER — Encounter: Payer: Self-pay | Admitting: Psychology

## 2023-11-25 DIAGNOSIS — F4010 Social phobia, unspecified: Secondary | ICD-10-CM | POA: Diagnosis not present

## 2023-11-25 NOTE — Progress Notes (Signed)
[facility] Counselor/Therapist Progress Note  Patient ID: Ray Park, MRN: [id number],    Date: 11/25/2023  Time Spent: 4:10 - 5:00 pm   Treatment Type: Individual Therapy  Met with patient for therapy session.  Patient was at the clinic and session was conducted from the therapist's office in person.    Reported Symptoms: Patient appears to have some processing deficits consistent with ADHD, and it is possible that he was able to compensate for attention problems in lower grades with high intelligence, but he is suffering more when greater organization and independence was required. 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 Recommendations include participating individual counseling to address these issues.   Patient has been going through much marital distress for the past five years with continued problems discussed during sessions.  This session focused on patient's rejection sensitivity.    Mental Status Exam: Appearance:  Neatly dressed and groomed      Behavior: Appropriate and Sharing  Motor: Normal  Speech/Language:  Clear and Coherent and Normal Rate  Affect: Full, appropriate  Mood: Disappointed  Thought process: normal  Thought content:   WNL  Sensory/Perceptual disturbances:   WNL  Orientation: oriented to person, place, time/date, and situation  Attention: Good  Concentration: Good  Memory: WNL  Fund of knowledge:  Good  Insight:   Good  Judgment:  Good  Impulse Control: Good   Risk Assessment: Danger to Self:  No Self-injurious Behavior: No Danger to Others: No Duty to Warn:no Physical Aggression / Violence:No  Access to Firearms a concern: No  Gang Involvement:No   Subjective: Patient stated that his wife was upset with him recently for him not giving her much physical affection  lately.  He told her his reasons why he has been less affectionate and she rejected them, stating that he was trying to blame her for his problems.  Patient indicated having much rejection sensitivity as a reason why he doesn't speak up, take risks, or have relationships with women who try to control him.           Interventions: ACT and mindfulness strategies focusing on non-judgement, especially not judging himself after a rejection.          Assessment: Patient focusing more on himself this session, trying to understand why he hasn't asserted himself as much as he needs.     Diagnosis:Social anxiety disorder  Plan: Regular therapy sessions to continue focusing on increasing awareness, assertiveness, and coping skills in order to lessen avoidance of difficult conversations.   Treatment plan was reviewed with patient/parents.  Patient/parents expressed agreement with the goals, objectives, and treatment methods identified in the treatment plan.    Treatment Plan Client Abilities/Strengths  Intelligence and thoughtful   Client Treatment Preferences  In person sessions   Client Statement of Needs  Patient appears to have some processing deficits consistent with ADHD, and it is possible that he was able to compensate for attention problems in lower grades with high intelligence, but he is suffering more when greater organization and independence was required. 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 Recommendations include participating individual counseling to address these issues.   Problems Addressed  Social Anxiety, Attention Deficit Disorder (ADD) -  Adult   Goals 1. Interact socially without undue fear or anxiety.  Objective Patient to speak assertively in interactions with wife during 80% of the interactions. Target Date:  2023-12-19  Progress: - 75   [name], PhD

## 2023-12-23 ENCOUNTER — Ambulatory Visit: Payer: BC Managed Care – PPO | Admitting: Psychology

## 2023-12-30 ENCOUNTER — Encounter: Payer: Self-pay | Admitting: Cardiology

## 2023-12-30 ENCOUNTER — Ambulatory Visit: Payer: BC Managed Care – PPO | Attending: Cardiology | Admitting: Cardiology

## 2023-12-30 VITALS — BP 126/78 | HR 79 | Ht 76.0 in | Wt 200.0 lb

## 2023-12-30 DIAGNOSIS — Z8679 Personal history of other diseases of the circulatory system: Secondary | ICD-10-CM

## 2023-12-30 DIAGNOSIS — E789 Disorder of lipoprotein metabolism, unspecified: Secondary | ICD-10-CM

## 2023-12-30 DIAGNOSIS — R001 Bradycardia, unspecified: Secondary | ICD-10-CM | POA: Diagnosis not present

## 2023-12-30 DIAGNOSIS — I959 Hypotension, unspecified: Secondary | ICD-10-CM

## 2023-12-30 DIAGNOSIS — R42 Dizziness and giddiness: Secondary | ICD-10-CM | POA: Diagnosis not present

## 2023-12-30 DIAGNOSIS — R9431 Abnormal electrocardiogram [ECG] [EKG]: Secondary | ICD-10-CM

## 2023-12-30 NOTE — Patient Instructions (Signed)
 Medication Instructions:   Your Physician recommend you continue on your current medication as directed.     *If you need a refill on your cardiac medications before your next appointment, please call your pharmacy*   Lab Work: None ordered.   If you have labs (blood work) drawn today and your tests are completely normal, you will receive your results only by: MyChart Message (if you have MyChart) OR A paper copy in the mail If you have any lab test that is abnormal or we need to change your treatment, we will call you to review the results.   Testing/Procedures: None ordered.   Follow-Up: At [facility], you and your health needs are our priority.  As part of our continuing mission to provide you with exceptional heart care, we have created designated Provider Care Teams.  These Care Teams include your primary Cardiologist (physician) and Advanced Practice Providers (APPs -  Physician Assistants and Nurse Practitioners) who all work together to provide you with the care you need, when you need it.  We recommend signing up for the patient portal called "MyChart".  Sign up information is provided on this After Visit Summary.  MyChart is used to connect with patients for Virtual Visits (Telemedicine).  Patients are able to view lab/test results, encounter notes, upcoming appointments, etc.  Non-urgent messages can be sent to your provider as well.   To learn more about what you can do with MyChart, go to [email or web address].    Your next appointment:   Follow up as needed  Provider:   You may see Dr. Herbie Baltimore or one of the following Advanced Practice Providers on your designated Care Team:   Nicolasa Ducking, NP Eula Listen, PA-C Cadence Fransico Michael, PA-C Charlsie Quest, NP Carlos Levering, NP

## 2023-12-30 NOTE — Progress Notes (Signed)
 Cardiology Office Note:  .   Date:  01/02/2024  ID:  Ray Park, DOB 1971/07/17, MRN [id number] PCP: Excell Seltzer, MD  [facility] Providers Cardiologist:  None     Chief Complaint  Patient presents with   New Patient (Initial Visit)    Evaluation of episodes of low blood pressure and low heart rate.  Not very frequent    Patient Profile: .     Ray Park is a 53 y.o. male  with no significant PMH who presents here for evaluation of Dizziness, Abnormal EKG, Hypotension and Bradycardia.  He is referred at the request of Excell Seltzer, MD.  No listed PMH other than Anxiety.  Was told in the past he may have mitral valve prolapse    Ray Park was seen on October 20, 2023 by Dr. Pattricia Boss.  Complained of chronic intermittent episodes of lightheadedness.  They noted heart rates were low and blood pressures were low during these episodes.  Needs care doctor eating so therefore not likely hypoglycemic.  Indicated adequate hydration.  EKG thought to have incomplete right bundle branch block.  As result, assessment and plan for all the problems was referred to cardiology.  During the visit blood pressure was 114/64 with a heart rate of 67 bpm..  Subjective  Discussed the use of AI scribe software for clinical note transcription with the patient, who gave verbal consent to proceed.  History of Present Illness   Ray Park "Ray Park" is a 53 year old male who presents with rare episodes of dizziness and nausea. He was referred by his primary care physician to rule out any cardiac issues.  He experiences rare episodes of dizziness and nausea approximately once every 1-2 years, with the most recent episode occurring a few months ago. During these episodes, he experiences a significant drop in blood pressure, recorded at 94/68 mmHg, and a pulse of 57 bpm. These episodes resolve after consuming food and drink, such as crackers and Park ale, and resting for about an hour. He  describes the worst episode nearly ten years ago, with symptoms of shaking and a strong urge to eat, feeling as though he might need emergency care. No consistent triggers have been identified, and no chest pain, palpitations, or irregular heartbeats during these episodes.  He has a history of slight mitral valve prolapse diagnosed around 2005-2007, evaluated with an ultrasound at that time. He does not take any medications and reports being generally healthy, with no issues of shortness of breath, leg swelling, or exertional chest pain.  His father has a history of heart issues, including atrial fibrillation, a pacemaker, and heart ablations, but no heart attacks. He himself has not experienced any heart-related symptoms outside of these episodes.  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 No significant symptoms during physical exertion, such as chest pain or shortness of breath.       Objective   No medications.  Family History - Father has some heart issues, pacemaker, atrial fibrillation, never had a heart attack, had ablations done, stents in place  Studies Reviewed: Marland Kitchen   EKG Interpretation Date/Time:  Thursday December 30 2023 08:29:46 EDT Ventricular Rate:  79 PR Interval:  172 QRS Duration:  88 QT Interval:  368 QTC Calculation: 421 R Axis:   -75  Text Interpretation: Normal sinus rhythm Left anterior fascicular block No previous ECGs available Confirmed by Bryan Lemma (  [id number]) on 12/30/2023 8:36:49 AM   No studies  Risk Assessment/Calculations:        Physical Exam:   VS:  BP 126/78 (BP Location: Left Arm, Patient Position: Sitting, Cuff Size: Normal)   Pulse 79   Ht 6\' 4"  (1.93 m)   Wt 200 lb (90.7 kg)   SpO2 98%   BMI 24.34 kg/m    Wt Readings from Last 3 Encounters:  12/30/23 200 lb (90.7 kg)  10/20/23 204 lb 3.2 oz (92.6 kg)  07/28/23 198 lb (89.8 kg)     GENERAL: Alert, cooperative, well developed, no acute distress HEENT: Normocephalic, normal oropharynx, moist mucous membranes CHEST: Clear to auscultation bilaterally, No wheezes, rhonchi, or crackles CARDIOVASCULAR: Normal heart rate and rhythm, S1 and S2 normal without murmurs, Heart normal on auscultation. ABDOMEN: Soft, non-tender, non-distended, without organomegaly, Normal bowel sounds EXTREMITIES: No cyanosis or edema NEUROLOGICAL: Cranial nerves grossly intact, Moves all extremities without gross motor or sensory deficit     ASSESSMENT AND PLAN: .    Problem List Items Addressed This Visit       Cardiology Problems   Hypotension   I think symptoms are quite consistent with Vasovagal episodes with unexplained drop in blood pressures and heart rate associated with nausea. Rare episodes of vasovagal syncope due to overactive vagal response, resolving with rest, hydration, and dietary measures. No exertion, chest pain, or palpitations noted. No further testing needed unless symptoms change. - Advise rest, hydration, and consumption of food and caffeine during episodes. - Educated on recognizing symptoms and managing episodes with rest and dietary measures.  Patient was reassured. - No further testing required unless symptoms change or worsen.        Other   Abnormal EKG - Primary   EKG suggest LAFB.  Otherwise normal.  No indication for further testing.      Relevant Orders   EKG 12-Lead (Completed)   Borderline high cholesterol   Bradycardia   Relevant Orders   EKG 12-Lead (Completed)   Dizziness   H/O mitral valve prolapse   Per report, he was told he had mitral prolapse some.  Between 2005-2007.  Has not had any issues since then. No murmur detected on exam.  At this point I think unless there is significant change in exam 12 minutes worth it to check a 2D echo.  I do not think the symptoms are related to valvular issue based on the mitral findings on exam         Follow-Up: No follow-ups on file.     Signed, Marykay Lex, MD, MS Bryan Lemma, M.D., M.S. Interventional Cardiologist  [facility]  Pager # [phone] Phone # [phone] [address] [place], [place] [address]

## 2024-01-02 ENCOUNTER — Encounter: Payer: Self-pay | Admitting: Cardiology

## 2024-01-02 DIAGNOSIS — Z8679 Personal history of other diseases of the circulatory system: Secondary | ICD-10-CM | POA: Insufficient documentation

## 2024-01-02 NOTE — Assessment & Plan Note (Signed)
 Per report, he was told he had mitral prolapse some.  Between 2005-2007.  Has not had any issues since then. No murmur detected on exam.  At this point I think unless there is significant change in exam 12 minutes worth it to check a 2D echo.  I do not think the symptoms are related to valvular issue based on the mitral findings on exam

## 2024-01-02 NOTE — Assessment & Plan Note (Signed)
 EKG suggest LAFB.  Otherwise normal.  No indication for further testing.

## 2024-01-02 NOTE — Assessment & Plan Note (Addendum)
 I think symptoms are quite consistent with Vasovagal episodes with unexplained drop in blood pressures and heart rate associated with nausea. Rare episodes of vasovagal syncope due to overactive vagal response, resolving with rest, hydration, and dietary measures. No exertion, chest pain, or palpitations noted. No further testing needed unless symptoms change. - Advise rest, hydration, and consumption of food and caffeine during episodes. - Educated on recognizing symptoms and managing episodes with rest and dietary measures.  Patient was reassured. - No further testing required unless symptoms change or worsen.

## 2024-01-20 ENCOUNTER — Ambulatory Visit: Payer: BC Managed Care – PPO | Admitting: Psychology

## 2024-01-20 ENCOUNTER — Encounter: Payer: Self-pay | Admitting: Psychology

## 2024-01-20 DIAGNOSIS — F4010 Social phobia, unspecified: Secondary | ICD-10-CM | POA: Diagnosis not present

## 2024-01-20 NOTE — Progress Notes (Signed)
 [facility] Counselor/Therapist Progress Note  Patient ID: Ray Park, MRN: [id number],    Date: 01/20/2024  Time Spent: 4:00 - 5:00 pm   Treatment Type: Individual Therapy  Met with patient for therapy session.  Patient was at the clinic and session was conducted from the therapist's office in person.    Reported Symptoms: Patient appears to have some processing deficits consistent with ADHD, and it is possible that he was able to compensate for attention problems in lower grades with high intelligence, but he is suffering more when greater organization and independence was required. 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 Recommendations include participating individual counseling to address these issues.   Patient has been going through much marital distress for the past five years with continued problems discussed during sessions.  This session continued to focus on patient's rejection sensitivity and fear that others will be upset with him when he asserts himself.    Mental Status Exam: Appearance:  Neatly dressed and groomed      Behavior: Appropriate and Sharing  Motor: Normal  Speech/Language:  Clear and Coherent and Normal Rate  Affect: Appropriate  Mood: Euthymic  Thought process: normal  Thought content:   WNL  Sensory/Perceptual disturbances:   WNL  Orientation: oriented to person, place, time/date, and situation  Attention: Good  Concentration: Good  Memory: WNL  Fund of knowledge:  Good  Insight:   Good  Judgment:  Good  Impulse Control: Good   Risk Assessment: Danger to Self:  No Self-injurious Behavior: No Danger to Others: No Duty to Warn:no Physical Aggression / Violence:No  Access to Firearms a concern: No  Gang Involvement:No   Subjective: Patient stated that his wife was  upset with him this time for putting them on a wait list for marital counseling without asking her first.  Patient indicated she continues to insult him, although she does it in a calmer way.  He also reported worrying that his sister may be taking advantage of their father financially.  At the end of the session he summed up his difficulty stating that "the bottom is that I worry about others getting made at me" which leads to much of his avoidance and lack of assertiveness.        Interventions: ACT and mindfulness strategies focusing on acceptance and distinguishing not liking a reaction versus being able to accept/handle it emotionally.            Assessment: Patient coming up with more insights but still has been hesitant to take action and change his situation, focusing more on what he'd lose than what he'd gain.       Diagnosis:Social anxiety disorder  Plan: Regular therapy sessions to continue focusing on increasing awareness, assertiveness, and coping skills in order to lessen avoidance of difficult conversations.   Treatment plan was reviewed with patient/parents.  Patient/parents expressed agreement with the goals, objectives, and treatment methods identified in the treatment plan.    Treatment Plan Client Abilities/Strengths  Intelligence and thoughtful   Client Treatment Preferences  In person sessions   Client Statement of Needs  Patient appears to have some processing deficits consistent with ADHD, and it is possible that he was able to compensate for attention problems in lower grades with high intelligence, but he is suffering more when greater organization and independence was required. 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 Recommendations include participating individual counseling to address these issues.  Objective date to be extended as this remains an important issue for patient and his progress has been positive but slow.  Problems Addressed  Social Anxiety, Attention Deficit Disorder (ADD) - Adult   Goals 1. Interact socially without undue fear or anxiety.  Objective Patient to speak assertively in interactions with wife during 80% of the interactions. Target Date: 2024-07-11  Progress: - 80   [name], PhD                                        [name], PhD

## 2024-02-17 ENCOUNTER — Encounter: Payer: Self-pay | Admitting: Psychology

## 2024-02-17 ENCOUNTER — Ambulatory Visit: Payer: BC Managed Care – PPO | Admitting: Psychology

## 2024-02-17 DIAGNOSIS — F4010 Social phobia, unspecified: Secondary | ICD-10-CM | POA: Diagnosis not present
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
## 2024-03-16 ENCOUNTER — Ambulatory Visit: Payer: BC Managed Care – PPO | Admitting: Psychology

## 2024-03-16 ENCOUNTER — Encounter: Payer: Self-pay | Admitting: Psychology

## 2024-03-16 DIAGNOSIS — F4010 Social phobia, unspecified: Secondary | ICD-10-CM | POA: Diagnosis not present
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
## 2024-04-13 ENCOUNTER — Encounter: Payer: Self-pay | Admitting: Psychology

## 2024-04-13 ENCOUNTER — Ambulatory Visit: Payer: BC Managed Care – PPO | Admitting: Psychology

## 2024-04-13 DIAGNOSIS — F4010 Social phobia, unspecified: Secondary | ICD-10-CM
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
## 2024-05-11 ENCOUNTER — Ambulatory Visit: Payer: BC Managed Care – PPO | Admitting: Psychology

## 2024-05-11 ENCOUNTER — Encounter: Payer: Self-pay | Admitting: Psychology

## 2024-05-11 DIAGNOSIS — F988 Other specified behavioral and emotional disorders with onset usually occurring in childhood and adolescence: Secondary | ICD-10-CM

## 2024-05-11 DIAGNOSIS — F4010 Social phobia, unspecified: Secondary | ICD-10-CM
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
## 2024-05-18 DIAGNOSIS — F4320 Adjustment disorder, unspecified: Secondary | ICD-10-CM | POA: Diagnosis not present

## 2024-06-08 ENCOUNTER — Ambulatory Visit: Payer: BC Managed Care – PPO | Admitting: Psychology

## 2024-06-08 ENCOUNTER — Ambulatory Visit: Admitting: Psychology

## 2024-06-23 ENCOUNTER — Encounter: Payer: Self-pay | Admitting: Family Medicine

## 2024-06-23 DIAGNOSIS — Z23 Encounter for immunization: Secondary | ICD-10-CM

## 2024-06-23 MED ORDER — COVID-19 MRNA VAC-TRIS(PFIZER) 30 MCG/0.3ML IM SUSY: 0.3000 mL | PREFILLED_SYRINGE | Freq: Once | INTRAMUSCULAR | 0 refills | Status: AC

## 2024-06-26 MED ORDER — COVID-19 MRNA VACC (MODERNA) 50 MCG/0.5ML IM SUSY: 0.5000 mL | PREFILLED_SYRINGE | Freq: Once | INTRAMUSCULAR | 0 refills | Status: AC

## 2024-06-26 NOTE — Addendum Note (Signed)
 Addended by: NARCISO ANDREZ BROCKS on: 06/26/2024 10:37 AM   Modules accepted: Orders

## 2024-07-06 ENCOUNTER — Encounter: Payer: Self-pay | Admitting: Psychology

## 2024-07-06 ENCOUNTER — Ambulatory Visit: Payer: BC Managed Care – PPO | Admitting: Psychology

## 2024-07-06 DIAGNOSIS — F4010 Social phobia, unspecified: Secondary | ICD-10-CM | POA: Diagnosis not present

## 2024-07-06 NOTE — Progress Notes (Signed)
 [facility] Counselor/Therapist Progress Note  Patient ID: Ray Park, MRN: [id number],    Date: 07/06/2024  Time Spent: 4:00 - 4:50 pm   Treatment Type: Individual Therapy  Met with patient for therapy session.  Patient was at the clinic and session was conducted from the therapist's office in person.    Reported Symptoms: Patient appears to have some processing deficits consistent with ADHD, and it is possible that he was able to compensate for attention problems in lower grades with high intelligence, but he is suffering more when greater organization and independence was required. 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 Recommendations include participating individual counseling to address these issues.   Patient has been going through much marital distress for the past five years with continued problems discussed during sessions.  This session focused on taking initiative and the factors that are keeping him from doing so.    Mental Status Exam: Appearance:  Neatly dressed and groomed      Behavior: Appropriate and Sharing  Motor: Normal  Speech/Language:  Clear and Coherent and Normal Rate  Affect: Appropriate  Mood: euthymic  Thought process: normal  Thought content:   WNL  Sensory/Perceptual disturbances:   WNL  Orientation: oriented to person, place, time/date, and situation  Attention: Good  Concentration: Good  Memory: WNL  Fund of knowledge:  Good  Insight:   Good  Judgment:  Good  Impulse Control: Good   Risk Assessment: Danger to Self:  No Self-injurious Behavior: No Danger to Others: No Duty to Warn:no Physical Aggression / Violence:No  Access to Firearms a concern: No  Gang Involvement:No   Subjective: Patient stated that he and his wife started marriage counseling with mixed  results.  They enjoyed a trip they recently took together but she becomes upset with him whenever he brings up an issue in therapy that is important to him.  He has also had difficulty following through on some of his therapy related assignments.  He expressed avoiding taking action at times (whether it is with his wife or family) due to not wanting to receive the negative feedback for doing so, because it is often insulting.            Interventions: ACT strategies focusing on accepting the likelihood of an unpleasant response and taking initiative for statements and actions that are important for the patient, such as how he likes to be treated or letting his wife or family know what he believes. Emphasis believing that he can handle the awkward emotions that come with conflict and that he has the ability to speak with urgency without being hurtful.              Assessment: Patient's difficulty with marital therapy is forcing him to take more initiative, which hopefully will lead to healthier/more reciprocal relationships.  The next step will be how he responds when he starts speaking his mind more and others do not acquiesce.    Diagnosis:Social anxiety disorder  Plan: Regular therapy sessions to continue focusing on increasing awareness, assertiveness, and coping skills in order to lessen avoidance of difficult conversations.   Treatment plan was reviewed with patient/parents.  Patient/parents expressed agreement with the goals, objectives, and treatment methods identified in the treatment plan.    Treatment Plan Client Abilities/Strengths  Intelligence and thoughtful   Client Treatment Preferences  In person sessions   Client Statement of Needs  Patient appears to have some processing deficits consistent with ADHD, and it is possible that he was able to compensate for attention problems in lower grades with high intelligence, but he is suffering more when greater organization and independence  was required. 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 Recommendations include participating individual counseling to address these issues. Objective was extended as this remains an important issue for patient and his progress has been positive but slow.  Problems Addressed  Social Anxiety, Attention Deficit Disorder (ADD) - Adult   Goals 1. Interact socially without undue fear or anxiety.  Objective Patient to speak assertively in interactions with wife during 80% of the interactions. Target Date: 2024-07-11  Progress: - 100 - Complete - new objective to be written for next session.    Objective Patient to initiate action with wife and family during 40% of the interactions. Target Date: 2025-01-08  Progress: - 0  Stephenie Navejas, PhD                                                                                          Malachi Suderman, PhD

## 2024-07-11 ENCOUNTER — Other Ambulatory Visit: Payer: Self-pay

## 2024-07-11 MED ORDER — COMIRNATY 30 MCG/0.3ML IM SUSY
0.3000 mL | PREFILLED_SYRINGE | Freq: Once | INTRAMUSCULAR | 0 refills | Status: AC
  Filled 2024-07-11: qty 0.3, 1d supply, fill #0

## 2024-07-12 ENCOUNTER — Other Ambulatory Visit: Payer: Self-pay | Admitting: Family Medicine

## 2024-07-12 ENCOUNTER — Telehealth: Payer: Self-pay | Admitting: Family Medicine

## 2024-07-12 DIAGNOSIS — Z8349 Family history of other endocrine, nutritional and metabolic diseases: Secondary | ICD-10-CM

## 2024-07-12 DIAGNOSIS — Z13220 Encounter for screening for lipoid disorders: Secondary | ICD-10-CM

## 2024-07-12 DIAGNOSIS — Z125 Encounter for screening for malignant neoplasm of prostate: Secondary | ICD-10-CM

## 2024-07-12 NOTE — Telephone Encounter (Signed)
 Copied from CRM [id number]. Topic: Appointments - Appointment Scheduling >> Jul 12, 2024 12:16 PM Rea ORN wrote: Pt scheduled CPE 10/17 and would like to have labs ordered. Please call back to schedule lab appt. Pt stated he prefers 7 am appts.  I called and left a voicemail for pt to be scheduled.

## 2024-07-12 NOTE — Telephone Encounter (Signed)
 Future labs orders in Epic.

## 2024-07-24 ENCOUNTER — Other Ambulatory Visit (INDEPENDENT_AMBULATORY_CARE_PROVIDER_SITE_OTHER)

## 2024-07-24 DIAGNOSIS — Z8349 Family history of other endocrine, nutritional and metabolic diseases: Secondary | ICD-10-CM

## 2024-07-24 DIAGNOSIS — Z125 Encounter for screening for malignant neoplasm of prostate: Secondary | ICD-10-CM | POA: Diagnosis not present

## 2024-07-24 DIAGNOSIS — Z13220 Encounter for screening for lipoid disorders: Secondary | ICD-10-CM

## 2024-07-24 LAB — COMPREHENSIVE METABOLIC PANEL WITH GFR
ALT: 22 U/L (ref 0–53)
AST: 20 U/L (ref 0–37)
Albumin: 4.7 g/dL (ref 3.5–5.2)
Alkaline Phosphatase: 60 U/L (ref 39–117)
BUN: 14 mg/dL (ref 6–23)
CO2: 29 meq/L (ref 19–32)
Calcium: 9.1 mg/dL (ref 8.4–10.5)
Chloride: 105 meq/L (ref 96–112)
Creatinine, Ser: 1.12 mg/dL (ref 0.40–1.50)
GFR: 74.09 mL/min (ref >60.00)
Glucose, Bld: 104 mg/dL — ABNORMAL HIGH (ref 70–99)
Potassium: 4.1 meq/L (ref 3.5–5.1)
Sodium: 141 meq/L (ref 135–145)
Total Bilirubin: 0.5 mg/dL (ref 0.2–1.2)
Total Protein: 6.7 g/dL (ref 6.0–8.3)

## 2024-07-24 LAB — LIPID PANEL
Cholesterol: 157 mg/dL (ref 0–200)
HDL: 34.4 mg/dL — ABNORMAL LOW (ref >39.00)
LDL Cholesterol: 84 mg/dL (ref 0–99)
NonHDL: 122.11
Total CHOL/HDL Ratio: 5
Triglycerides: 192 mg/dL — ABNORMAL HIGH (ref 0.0–149.0)
VLDL: 38.4 mg/dL (ref 0.0–40.0)

## 2024-07-24 LAB — T3, FREE: T3, Free: 3.5 pg/mL (ref 2.3–4.2)

## 2024-07-24 LAB — TSH: TSH: 2.92 u[IU]/mL (ref 0.35–5.50)

## 2024-07-24 LAB — T4, FREE: Free T4: 0.65 ng/dL (ref 0.60–1.60)

## 2024-07-24 LAB — PSA: PSA: 1.3 ng/mL (ref 0.10–4.00)

## 2024-07-25 ENCOUNTER — Ambulatory Visit: Payer: Self-pay | Admitting: Family Medicine

## 2024-07-25 NOTE — Progress Notes (Signed)
 No critical labs need to be addressed urgently. We will discuss labs in detail at upcoming office visit.

## 2024-07-28 ENCOUNTER — Ambulatory Visit (INDEPENDENT_AMBULATORY_CARE_PROVIDER_SITE_OTHER): Admitting: Family Medicine

## 2024-07-28 VITALS — BP 90/60 | HR 62 | Temp 99.0°F | Ht 75.0 in | Wt 193.1 lb

## 2024-07-28 DIAGNOSIS — R7303 Prediabetes: Secondary | ICD-10-CM

## 2024-07-28 DIAGNOSIS — Z23 Encounter for immunization: Secondary | ICD-10-CM

## 2024-07-28 DIAGNOSIS — Z0000 Encounter for general adult medical examination without abnormal findings: Secondary | ICD-10-CM

## 2024-07-28 NOTE — Patient Instructions (Signed)
 Work on low carb diet and decrease portion sizes. Can consider recheck cholesterol and sugar in 3 months.

## 2024-07-28 NOTE — Progress Notes (Signed)
 Patient ID: Ray Park, male    DOB: 03/06/1971, 53 y.o.   MRN: [id number]  This visit was conducted in person.  BP 90/60   Pulse 62   Temp 99 F (37.2 C) (Oral)   Ht 6' 3 (1.905 m)   Wt 193 lb 2 oz (87.6 kg)   SpO2 97%   BMI 24.14 kg/m     CC:  Chief Complaint  Patient presents with   Annual Exam     Subjective:   HPI: Ray Park is a 52 y.o. male presenting on 07/28/2024 for Annual Exam  Reviewed labs with patient in detail   Diet: heart healthy diet  Exercise: walking, lifting at work.  Water intake, good  Body mass index is 24.14 kg/m. Wt Readings from Last 3 Encounters:  07/28/24 193 lb 2 oz (87.6 kg)  12/30/23 200 lb (90.7 kg)  10/20/23 204 lb 3.2 oz (92.6 kg)    Lab Results  Component Value Date   CHOL 157 07/24/2024   HDL 34.40 (L) 07/24/2024   LDLCALC 84 07/24/2024   LDLDIRECT 95.0 11/20/2014   TRIG 192.0 (H) 07/24/2024   CHOLHDL 5 07/24/2024   The 10-year ASCVD risk score (Arnett DK, et al., 2019) is: 3.2%   Values used to calculate the score:     Age: 28 years     Clincally relevant sex: Male     Is Non-Hispanic African American: No     Diabetic: No     Tobacco smoker: No     Systolic Blood Pressure: 90 mmHg     Is BP treated: No     HDL Cholesterol: 34.4 mg/dL     Total Cholesterol: 157 mg/dL  ADD, minimal symptoms: no longer on Adderall as not helpful.  Going to counselor.. monthly.  He is also seeing marriage counselor with wife.     07/28/2024    4:19 PM 10/20/2023    9:01 AM 07/28/2023   10:17 AM  Depression screen PHQ 2/9  Decreased Interest 0 0 0  Down, Depressed, Hopeless 0 0 0  PHQ - 2 Score 0 0 0  Altered sleeping  0 0  Tired, decreased energy  0 0  Change in appetite  0 0  Feeling bad or failure about yourself   0 0  Trouble concentrating  0 0  Moving slowly or fidgety/restless  0 0  Suicidal thoughts  0 0  PHQ-9 Score  0 0  Difficult doing work/chores  Not difficult at all Not difficult at all       10/20/2023    9:01 AM 07/28/2023   10:18 AM  GAD 7 : Generalized Anxiety Score  Nervous, Anxious, on Edge 0 1  Control/stop worrying 0 0  Worry too much - different things 0 0  Trouble relaxing 0 0  Restless 0 0  Easily annoyed or irritable 0 0  Afraid - awful might happen 0 0  Total GAD 7 Score 0 1  Anxiety Difficulty Not difficult at all Not difficult at all    BP Readings from Last 3 Encounters:  07/28/24 90/60  12/30/23 126/78  10/20/23 114/64     Relevant past medical, surgical, family and social history reviewed and updated as indicated. Interim medical history since our last visit reviewed. Allergies and medications reviewed and updated. No outpatient medications prior to visit.   No facility-administered medications prior to visit.     Per HPI unless specifically indicated in ROS section below Review of  Systems  Constitutional:  Negative for fatigue and fever.  HENT:  Negative for ear pain.   Eyes:  Negative for pain.  Respiratory:  Negative for cough and shortness of breath.   Cardiovascular:  Negative for chest pain, palpitations and leg swelling.  Gastrointestinal:  Positive for constipation. Negative for abdominal pain.  Genitourinary:  Negative for dysuria.  Musculoskeletal:  Negative for arthralgias.  Neurological:  Negative for syncope, light-headedness and headaches.  Psychiatric/Behavioral:  Negative for dysphoric mood.    Objective:  BP 90/60   Pulse 62   Temp 99 F (37.2 C) (Oral)   Ht 6' 3 (1.905 m)   Wt 193 lb 2 oz (87.6 kg)   SpO2 97%   BMI 24.14 kg/m   Wt Readings from Last 3 Encounters:  07/28/24 193 lb 2 oz (87.6 kg)  12/30/23 200 lb (90.7 kg)  10/20/23 204 lb 3.2 oz (92.6 kg)    BP Readings from Last 3 Encounters:  07/28/24 90/60  12/30/23 126/78  10/20/23 114/64          Physical Exam Constitutional:      Appearance: He is well-developed.  HENT:     Head: Normocephalic.     Right Ear: Hearing normal.     Left Ear:  Hearing normal.     Nose: Nose normal.  Neck:     Thyroid : No thyroid  mass or thyromegaly.     Vascular: No carotid bruit.     Trachea: Trachea normal.  Cardiovascular:     Rate and Rhythm: Normal rate and regular rhythm.     Pulses: Normal pulses.     Heart sounds: Heart sounds not distant. No murmur heard.    No friction rub. No gallop.     Comments: No peripheral edema Pulmonary:     Effort: Pulmonary effort is normal. No respiratory distress.     Breath sounds: Normal breath sounds.  Skin:    General: Skin is warm and dry.     Findings: No rash.  Psychiatric:        Speech: Speech normal.        Behavior: Behavior normal.        Thought Content: Thought content normal.   Results for orders placed or performed in visit on 07/24/24  TSH   Collection Time: 07/24/24  7:28 AM  Result Value Ref Range   TSH 2.92 0.35 - 5.50 uIU/mL  T4, free   Collection Time: 07/24/24  7:28 AM  Result Value Ref Range   Free T4 0.65 0.60 - 1.60 ng/dL  T3, free   Collection Time: 07/24/24  7:28 AM  Result Value Ref Range   T3, Free 3.5 2.3 - 4.2 pg/mL  PSA   Collection Time: 07/24/24  7:28 AM  Result Value Ref Range   PSA 1.30 0.10 - 4.00 ng/mL  Lipid panel   Collection Time: 07/24/24  7:28 AM  Result Value Ref Range   Cholesterol 157 0 - 200 mg/dL   Triglycerides 807.9 (H) 0.0 - 149.0 mg/dL   HDL 65.59 (L) >60.99 mg/dL   VLDL 61.5 0.0 - 59.9 mg/dL   LDL Cholesterol 84 0 - 99 mg/dL   Total CHOL/HDL Ratio 5    NonHDL 122.11   Comprehensive metabolic panel   Collection Time: 07/24/24  7:28 AM  Result Value Ref Range   Sodium 141 135 - 145 mEq/L   Potassium 4.1 3.5 - 5.1 mEq/L   Chloride 105 96 - 112 mEq/L   CO2  29 19 - 32 mEq/L   Glucose, Bld 104 (H) 70 - 99 mg/dL   BUN 14 6 - 23 mg/dL   Creatinine, Ser 8.87 0.40 - 1.50 mg/dL   Total Bilirubin 0.5 0.2 - 1.2 mg/dL   Alkaline Phosphatase 60 39 - 117 U/L   AST 20 0 - 37 U/L   ALT 22 0 - 53 U/L   Total Protein 6.7 6.0 - 8.3 g/dL    Albumin 4.7 3.5 - 5.2 g/dL   GFR 25.90 >39.99 mL/min   Calcium 9.1 8.4 - 10.5 mg/dL     COVID 19 screen:  No recent travel or known exposure to COVID19 The patient denies respiratory symptoms of COVID 19 at this time. The importance of social distancing was discussed today.   Assessment and Plan   The patient's preventative maintenance and recommended screening tests for an annual wellness exam were reviewed in full today. Brought up to date unless services declined.  Counselled on the importance of diet, exercise, and its role in overall health and mortality. The patient's FH and SH was reviewed, including their home life, tobacco status, and drug and alcohol status.   Vaccines: Uptodate with COVID19 x 4,  Td , flu  and shingrix uptodate  Prostate Cancer Screen:  Lab Results  Component Value Date   PSA 1.30 07/24/2024   PSA 1.41 07/19/2023   PSA 0.92 07/08/2022  Colon Cancer Screen:  05/19/21 plan repeat in 5 years      Smoking Status: none ETOH/ drug use:  Every few weeks/none  HIV screen:  none Hep C: done  Problem List Items Addressed This Visit     Prediabetes   Other Visit Diagnoses       Routine general medical examination at a health care facility    -  Primary     Need for vaccination against Streptococcus pneumoniae       Relevant Orders   Pneumococcal conjugate vaccine 20-valent (Prevnar 20) (Completed)           Greig Ring, MD

## 2024-08-03 ENCOUNTER — Encounter: Payer: Self-pay | Admitting: Psychology

## 2024-08-03 ENCOUNTER — Ambulatory Visit: Payer: BC Managed Care – PPO | Admitting: Psychology

## 2024-08-03 DIAGNOSIS — F4010 Social phobia, unspecified: Secondary | ICD-10-CM

## 2024-08-03 NOTE — Progress Notes (Addendum)
 [facility] Counselor/Therapist Progress Note  Patient ID: Ray Park, MRN: [id number],    Date: 08/07/2024  Time Spent: 4:00 - 4:50 pm   Treatment Type: Individual Therapy  Met with patient for therapy session.  Patient was at the clinic and session was conducted from the therapist's office in person.    Reported Symptoms: Patient appears to have some processing deficits consistent with ADHD, and it is possible that he was able to compensate for attention problems in lower grades with high intelligence, but he is suffering more when greater organization and independence was required. 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 Recommendations include participating individual counseling to address these issues.   Patient has been going through much marital distress for the past five years with continued problems discussed during sessions.  This session focused on taking initiative and the factors that are keeping him from doing so.    Mental Status Exam: Appearance:  Neatly dressed and groomed      Behavior: Appropriate and Sharing  Motor: Normal  Speech/Language:  Clear and Coherent and Normal Rate  Affect: Appropriate  Mood: euthymic  Thought process: normal  Thought content:   WNL  Sensory/Perceptual disturbances:   WNL  Orientation: oriented to person, place, time/date, and situation  Attention: Good  Concentration: Good  Memory: WNL  Fund of knowledge:  Good  Insight:   Good  Judgment:  Good  Impulse Control: Good   Risk Assessment: Danger to Self:  No Self-injurious Behavior: No Danger to Others: No Duty to Warn:no Physical Aggression / Violence:No  Access to Firearms a concern: No  Gang Involvement:No   Subjective: Patient stated that he and his wife have continued marriage counseling with  mixed results.  He stated that she doesn't say much during sessions and becomes angry with patient when he brings up issues during session that she doesn't like.  He also indicated that she insults him indirectly now by saying he is acting stupid instead of saying he is dumb.  He was more concerned with his father's deteriorating health and his sister's lack of effort in caring for him or the house when she and patient's father live.  He mentioned being able to express how he felt toward his sister calmly, despite the negative reaction from her.  He has more difficulty doing this with his wife.              Interventions: CBT strategies including prioritization were discussed along with continued work with assertiveness training.               Assessment: Patient making progress in being able to speak assertively with his family, but still has trouble doing so in his marriage.  Also, he needs to prioritize how hs wife speaks to him along with encouraging her participation in sessions.    Diagnosis:Social anxiety disorder  Plan: Regular therapy sessions to continue focusing on increasing awareness, assertiveness, and coping skills in order to lessen avoidance of difficult conversations.   Treatment plan was reviewed with patient/parents.  Patient/parents expressed agreement with the goals, objectives, and treatment methods identified in the treatment plan.    Treatment Plan Client Abilities/Strengths  Intelligence and thoughtful   Client Treatment Preferences  In person sessions   Client Statement of Needs  Patient appears to have some processing deficits consistent with ADHD,  and it is possible that he was able to compensate for attention problems in lower grades with high intelligence, but he is suffering more when greater organization and independence was required. 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 Recommendations include participating individual counseling to address these issues. Objective was extended as this remains an important issue for patient and his progress has been positive but slow.  Problems Addressed  Social Anxiety, Attention Deficit Disorder (ADD) - Adult   Goals 1. Interact socially without undue fear or anxiety.  Objective Patient to initiate action with wife and family during 40% of the interactions. Target Date: 2025-01-08  Progress: - 10  Ray Fouche, PhD                                                                                                        Ray Lizak, PhD

## 2024-08-10 ENCOUNTER — Encounter: Payer: Self-pay | Admitting: Family Medicine

## 2024-08-10 DIAGNOSIS — R41840 Attention and concentration deficit: Secondary | ICD-10-CM

## 2024-08-31 ENCOUNTER — Ambulatory Visit: Payer: BC Managed Care – PPO | Admitting: Psychology

## 2024-08-31 ENCOUNTER — Encounter: Payer: Self-pay | Admitting: Psychology

## 2024-08-31 DIAGNOSIS — F4010 Social phobia, unspecified: Secondary | ICD-10-CM

## 2024-08-31 NOTE — Progress Notes (Signed)
 [facility] Counselor/Therapist Progress Note  Patient ID: Ray Park, MRN: [id number],    Date: 08/03/2024  Time Spent: 4:00 - 4:55 pm   Treatment Type: Individual Therapy  Met with patient for therapy session.  Patient was at the clinic and session was conducted from the therapist's office in person.    Reported Symptoms: Patient appears to have some processing deficits consistent with ADHD, and it is possible that he was able to compensate for attention problems in lower grades with high intelligence, but he is suffering more when greater organization and independence was required. 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 Recommendations include participating individual counseling to address these issues.   Patient has been going through much marital distress for the past five years with continued problems discussed during sessions.  This session continued to focus on taking initiative along with setting goals and planning responses.      Mental Status Exam: Appearance:  Neatly dressed and groomed      Behavior: Appropriate and Sharing  Motor: Normal  Speech/Language:  Clear and Coherent and Normal Rate  Affect: Appropriate  Mood: euthymic  Thought process: normal  Thought content:   WNL  Sensory/Perceptual disturbances:   WNL  Orientation: oriented to person, place, time/date, and situation  Attention: Good  Concentration: Good  Memory: WNL  Fund of knowledge:  Good  Insight:   Good  Judgment:  Good  Impulse Control: Good   Risk Assessment: Danger to Self:  No Self-injurious Behavior: No Danger to Others: No Duty to Warn:no Physical Aggression / Violence:No  Access to Firearms a concern: No  Gang Involvement:No   Subjective: Patient stated that he and his wife have continued marriage  counseling with mixed results.  He stated she has begun to say more during sessions, and has shown some vulnerability, but then becomes angry with patient when they return home from session.  He also indicated that she doesn't full yell at him but instead raises her voice slightly and speaks to him in a demeaning tone when he does not do activities to her specifications.   He also continues to have difficulty managing his sister's volatility and they will be getting together for Thanksgiving dinner.             Interventions: CBT strategies including planning and goal setting were discussed, along with continued work with assertiveness training.               Assessment: Patient making slight progress in being able to speak assertively with his family and marriage but often waits until he's acquiesced several times before doing so, which reinforces their behavior and leads to a strong response when he finally tales a stand.      Diagnosis:Social anxiety disorder  Plan: Regular therapy sessions to continue focusing on increasing awareness, assertiveness, and coping skills in order to lessen avoidance of difficult conversations.   Treatment plan was reviewed with patient/parents.  Patient/parents expressed agreement with the goals, objectives, and treatment methods identified in the treatment plan.    Treatment Plan Client Abilities/Strengths  Intelligence and thoughtful   Client Treatment Preferences  In person sessions   Client Statement of Needs  Patient appears to have some processing deficits consistent with ADHD, and it is possible that he was able to compensate for attention problems in lower grades with  high intelligence, but he is suffering more when greater organization and independence was required. 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 Recommendations include participating individual counseling to address these issues. Objective was extended as this remains an important issue for patient and his progress has been positive but slow.  Problems Addressed  Social Anxiety, Attention Deficit Disorder (ADD) - Adult   Goals 1. Interact socially without undue fear or anxiety.  Objective Patient to initiate action with wife and family during 40% of the interactions. Target Date: 2025-01-08  Progress: - 20  Angele Wiemann, PhD

## 2024-09-15 DIAGNOSIS — F4320 Adjustment disorder, unspecified: Secondary | ICD-10-CM | POA: Diagnosis not present

## 2024-09-27 ENCOUNTER — Encounter: Payer: Self-pay | Admitting: *Deleted

## 2024-09-27 NOTE — Telephone Encounter (Signed)
 MyChart message sent to [facility] in regards to the [facility] ADHD referral.  I called and ask him to check the MyChart message due to [facility] now requires their adult referrals to email them and I have provided that information in the MyChart message.  Patient states understanding.

## 2024-09-28 ENCOUNTER — Ambulatory Visit: Payer: BC Managed Care – PPO | Admitting: Psychology

## 2024-09-28 ENCOUNTER — Encounter: Payer: Self-pay | Admitting: Psychology

## 2024-09-28 DIAGNOSIS — F4010 Social phobia, unspecified: Secondary | ICD-10-CM | POA: Diagnosis not present

## 2024-09-28 NOTE — Progress Notes (Signed)
 [facility] Counselor/Therapist Progress Note  Patient ID: Ray Park, MRN: [id number],    Date: 08/03/2024  Time Spent: 4:00 - 4:55 pm   Treatment Type: Individual Therapy  Met with patient for therapy session.  Patient was at the clinic and session was conducted from the therapist's office in person.    Reported Symptoms: Patient appears to have some processing deficits consistent with ADHD, and it is possible that he was able to compensate for attention problems in lower grades with high intelligence, but he is suffering more when greater organization and independence was required. 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 Recommendations include participating individual counseling to address these issues.   Patient has been going through much marital distress for the past five years with continued problems discussed during sessions.  This session continued to focus on taking initiative along with setting goals and planning responses.      Mental Status Exam: Appearance:  Neatly dressed and groomed      Behavior: Appropriate and Sharing  Motor: Normal  Speech/Language:  Clear and Coherent and Normal Rate  Affect: Appropriate  Mood: euthymic  Thought process: normal  Thought content:   WNL  Sensory/Perceptual disturbances:   WNL  Orientation: oriented to person, place, time/date, and situation  Attention: Good  Concentration: Good  Memory: WNL  Fund of knowledge:  Good  Insight:   Good  Judgment:  Good  Impulse Control: Good   Risk Assessment: Danger to Self:  No Self-injurious Behavior: No Danger to Others: No Duty to Warn:no Physical Aggression / Violence:No  Access to Firearms a concern: No  Gang Involvement:No   Subjective: Patient stated that he and his wife have continued marriage  counseling with minimal progress, as she becomes highly upset with him after the sessions if he mentions anything critical of her or about what he wants from the relationship.  This keeps him from speaking honestly about their dynamic as he tries to carefully word what he wants to say.  He mentioned having somewhat better success with managing his sister's volatility as he had some assistance and corroboration from his brother in law in speaking with her.             Interventions: ACT strategies including acceptance that he cannot control her responses during marital therapy and that is likely to become upset when he raises what he believes to be the main issues with their dynamic.  Emphasis was on him understanding that he must be honest with his feelings for martial therapy to be successful and that by watering down or couching what he says he diminishes its effectiveness.     Assessment: Patient continuing to make slight progress in being able to speak assertively with his family and marriage a;though still seems to be putting much effort into trying to says things in a way that doesn't offend th people closest to him for fear of a volatile/negative reaction.        Diagnosis:Social anxiety disorder  Plan: Regular therapy sessions to continue focusing on increasing awareness, assertiveness, and coping skills in order to lessen avoidance of difficult conversations.   Treatment plan was reviewed with patient/parents.  Patient/parents expressed agreement with the goals, objectives, and treatment methods identified in the treatment plan.    Treatment Plan Client Abilities/Strengths  Intelligence and thoughtful   Client  Treatment Preferences  In person sessions   Client Statement of Needs  Patient appears to have some processing deficits consistent with ADHD, and it is possible that he was able to compensate for attention problems in lower grades with high intelligence, but he is suffering more  when greater organization and independence was required. 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 Recommendations include participating individual counseling to address these issues. Objective was extended as this remains an important issue for patient and his progress has been positive but slow.  Problems Addressed  Social Anxiety, Attention Deficit Disorder (ADD) - Adult   Goals 1. Interact socially without undue fear or anxiety.  Objective Patient to initiate action with wife and family during 40% of the interactions. Target Date: 2025-01-08  Progress: - 25  Elide Stalzer, PhD                                                                                                                   Theran Vandergrift, PhD

## 2024-11-08 ENCOUNTER — Ambulatory Visit: Admitting: Psychology

## 2024-11-08 ENCOUNTER — Encounter: Payer: Self-pay | Admitting: Psychology

## 2024-11-08 DIAGNOSIS — F4010 Social phobia, unspecified: Secondary | ICD-10-CM

## 2024-11-08 NOTE — Progress Notes (Signed)
 "  [facility] Counselor/Therapist Progress Note  Patient ID: Ray Park, MRN: [id number],    Date: 11/08/2024  Time Spent: 10:30 - 11:20 am   Treatment Type: Individual Therapy  Met with patient for therapy session.  Patient was at the clinic and session was conducted from the therapist's office in person.    Reported Symptoms: Patient appears to have some processing deficits consistent with ADHD, and it is possible that he was able to compensate for attention problems in lower grades with high intelligence, but he is suffering more when greater organization and independence was required. 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 Recommendations include participating individual counseling to address these issues.   Patient has been going through much marital distress for the past five years with continued problems discussed during sessions.  This session continued to focus on taking initiative along with setting goals and planning responses.      Mental Status Exam: Appearance:  Neatly dressed and groomed      Behavior: Appropriate and Sharing  Motor: Normal  Speech/Language:  Clear and Coherent and Normal Rate  Affect: Appropriate  Mood: euthymic  Thought process: normal  Thought content:   WNL  Sensory/Perceptual disturbances:   WNL  Orientation: oriented to person, place, time/date, and situation  Attention: Good  Concentration: Good  Memory: WNL  Fund of knowledge:  Good  Insight:   Good  Judgment:  Good  Impulse Control: Good   Risk Assessment: Danger to Self:  No Self-injurious Behavior: No Danger to Others: No Duty to Warn:no Physical Aggression / Violence:No  Access to Firearms a concern: No  Gang Involvement:No   Subjective: Patient stated that he and his wife have continued marriage  counseling with minimal progress, as she continues to deny problems with how she treats him or her ability to change how she does so.  She has been pushing to be evaluated for ADHD again as the previous evaluation yielded inconclusive results.  Patient also mentioned being frustrated at work as well as with his family, feeling that there is no place where he can speak honestly without severe pushback from others.           Interventions: ACT strategies including acceptance about the nature of the people he is associating with and making a commitment to finding an outlet where he can speak more honestly.    Assessment: Patient has received more tension and negative feedback from others as he has been speaking more assertively.  Hopefully this will not discourage him from continuing to do so.         Diagnosis:Social anxiety disorder  Plan: Regular therapy sessions to continue focusing on increasing awareness, assertiveness, and coping skills in order to lessen avoidance of difficult conversations.   Treatment plan was reviewed with patient/parents.  Patient/parents expressed agreement with the goals, objectives, and treatment methods identified in the treatment plan.    Treatment Plan Client Abilities/Strengths  Intelligence and thoughtful   Client Treatment Preferences  In person sessions   Client Statement of Needs  Patient appears to have some processing deficits consistent with ADHD, and it is possible that he was able to compensate for attention problems in lower grades with high intelligence, but he is suffering more when greater organization and independence was required. 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 Recommendations include participating individual counseling to address these issues. Objective was extended as this  remains an important issue for patient and his progress has been positive but slow.  Problems Addressed  Social Anxiety, Attention Deficit Disorder (ADD) - Adult   Goals 1. Interact socially without undue fear or anxiety.  Objective Patient to initiate action with wife and family during 40% of the interactions. Target Date: 2025-01-08  Progress: - 35  Dez Stauffer, PhD                                                                                                                   Amarianna Abplanalp, PhD               Kalissa Grays, PhD "

## 2024-12-06 ENCOUNTER — Ambulatory Visit: Admitting: Psychology

## 2025-01-31 ENCOUNTER — Ambulatory Visit: Admitting: Psychology
